# Patient Record
Sex: Female | Born: 1945 | Race: White | Hispanic: No | Marital: Married | Smoking: Never smoker
Health system: Southern US, Community
[De-identification: ages and names within clinical notes are randomized; demographics above are authoritative.]

## PROBLEM LIST (undated history)

## (undated) DIAGNOSIS — I10 Essential (primary) hypertension: Secondary | ICD-10-CM

## (undated) DIAGNOSIS — E785 Hyperlipidemia, unspecified: Secondary | ICD-10-CM

## (undated) DIAGNOSIS — C801 Malignant (primary) neoplasm, unspecified: Secondary | ICD-10-CM

## (undated) DIAGNOSIS — E119 Type 2 diabetes mellitus without complications: Secondary | ICD-10-CM

## (undated) DIAGNOSIS — E079 Disorder of thyroid, unspecified: Secondary | ICD-10-CM

## (undated) HISTORY — DX: Malignant (primary) neoplasm, unspecified: C80.1

## (undated) HISTORY — DX: Essential (primary) hypertension: I10

## (undated) HISTORY — PX: CYST EXCISION: SHX5701

## (undated) HISTORY — DX: Disorder of thyroid, unspecified: E07.9

## (undated) HISTORY — DX: Hyperlipidemia, unspecified: E78.5

## (undated) HISTORY — DX: Type 2 diabetes mellitus without complications: E11.9

---

## 2005-05-26 ENCOUNTER — Ambulatory Visit (HOSPITAL_COMMUNITY): Admission: RE | Admit: 2005-05-26 | Discharge: 2005-05-26 | Payer: Self-pay | Admitting: Specialist

## 2006-07-03 ENCOUNTER — Ambulatory Visit (HOSPITAL_COMMUNITY): Admission: RE | Admit: 2006-07-03 | Discharge: 2006-07-03 | Payer: Self-pay | Admitting: Unknown Physician Specialty

## 2009-02-16 ENCOUNTER — Ambulatory Visit (HOSPITAL_COMMUNITY): Admission: RE | Admit: 2009-02-16 | Discharge: 2009-02-16 | Payer: Self-pay | Admitting: Unknown Physician Specialty

## 2010-08-10 IMAGING — CT CT CHEST W/ CM
2 of 5 series · 13 of 36 positions shown, 16 images · IV contrast (Omnipaque 300)
Comparison: 07/03/2006.

CT CHEST

CLINICAL DATA: History of endometrial carcinoma.  Left-sided back
and abdominal pain.  Left hip pain.

CT CHEST, ABDOMEN AND PELVIS WITH CONTRAST
TECHNIQUE: Multidetector CT imaging of the chest, abdomen and
pelvis was performed following the standard protocol during bolus
administration of intravenous contrast.
Contrast: 100 ml 9mnipaque-BJJ.

[Series 2: cap with 5.0 b40f · axial · 0.64mm/px · z∈[-592,-102]mm · 10 of 118 slices shown, 13 images]
[im 10/118  mediastinal]
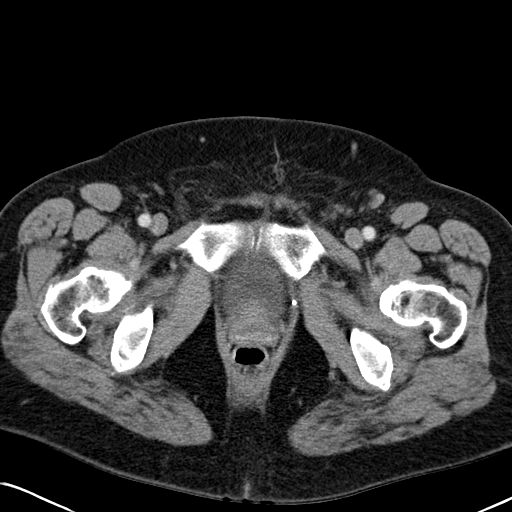
[im 10/118  lung]
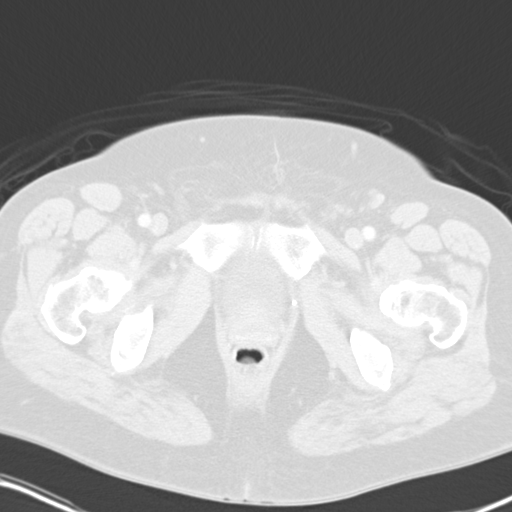
[im 20/118  lung]
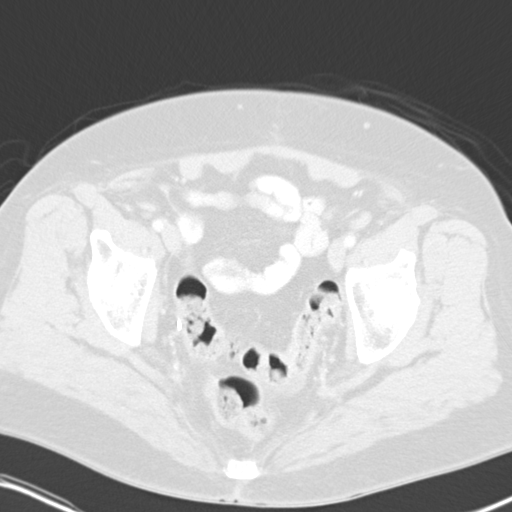
[im 30/118  lung]
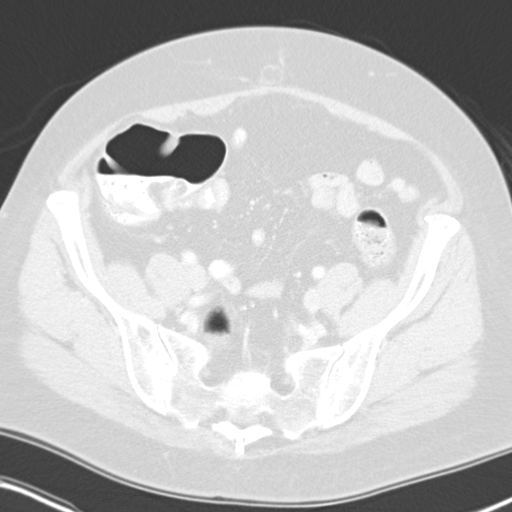
[im 40/118  lung]
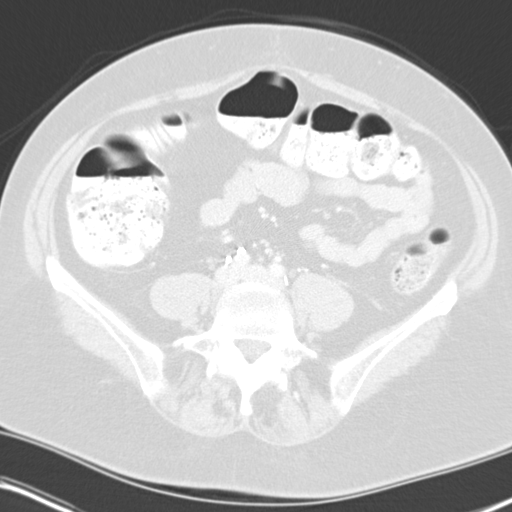
[im 49/118  mediastinal]
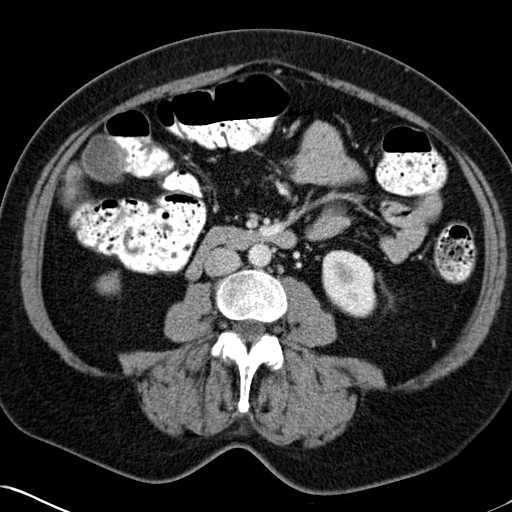
[im 49/118  lung]
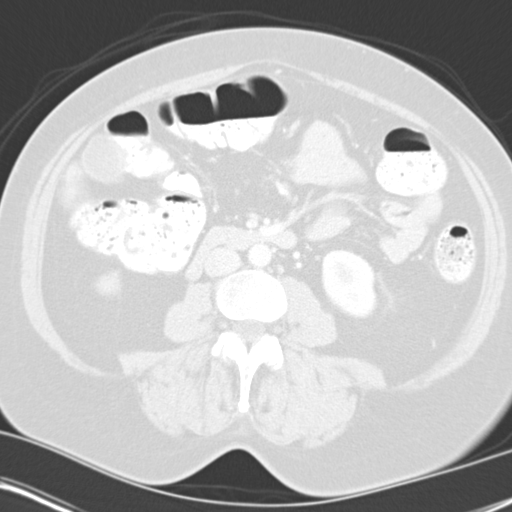
[im 69/118  lung]
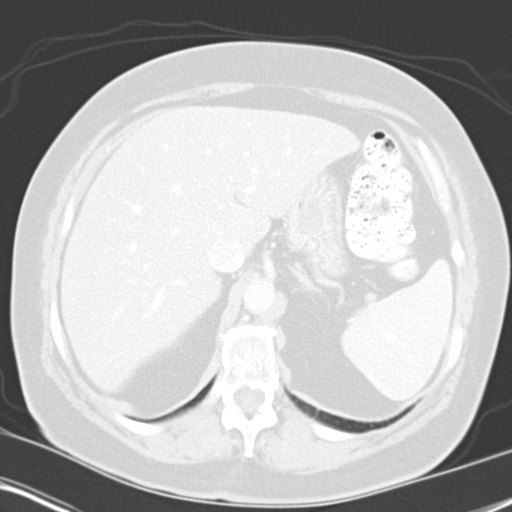
[im 79/118  lung]
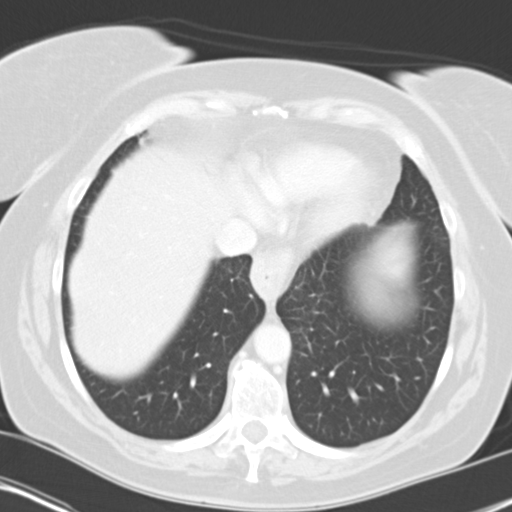
[im 88/118  lung]
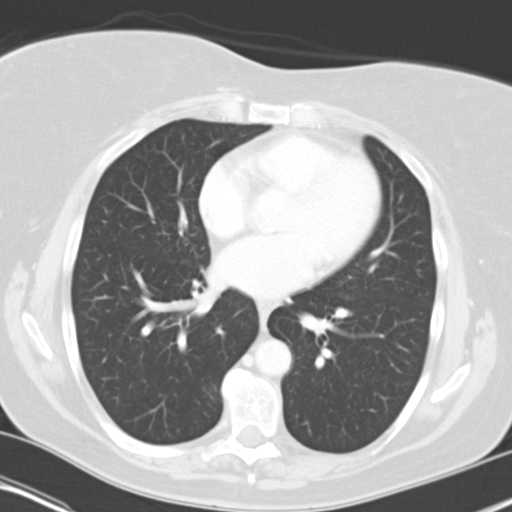
[im 98/118  mediastinal]
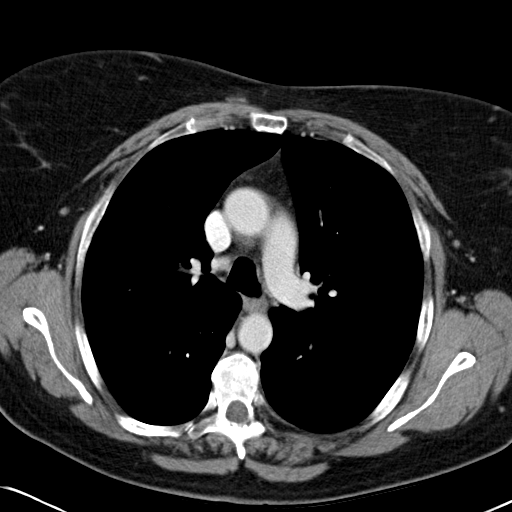
[im 98/118  lung]
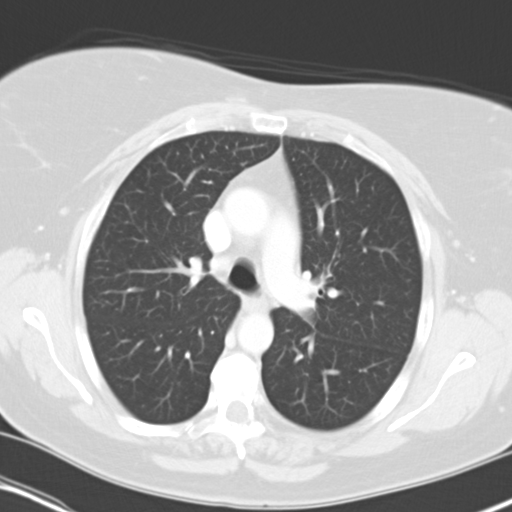
[im 108/118  lung]
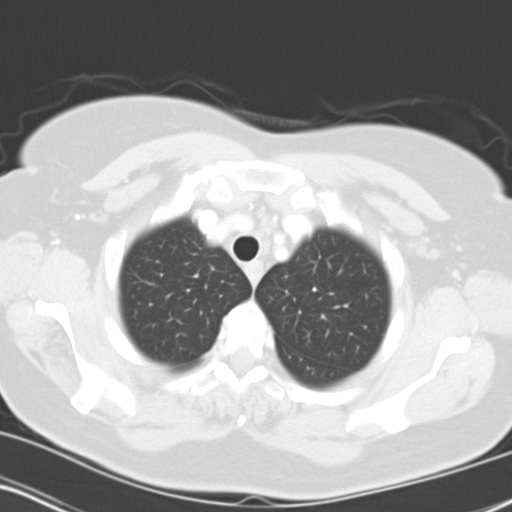

[Series 4: mpr cor post contrast (id) · coronal · 0.61mm/px · 3 of 90 slices shown]
[im 18/90  lung]
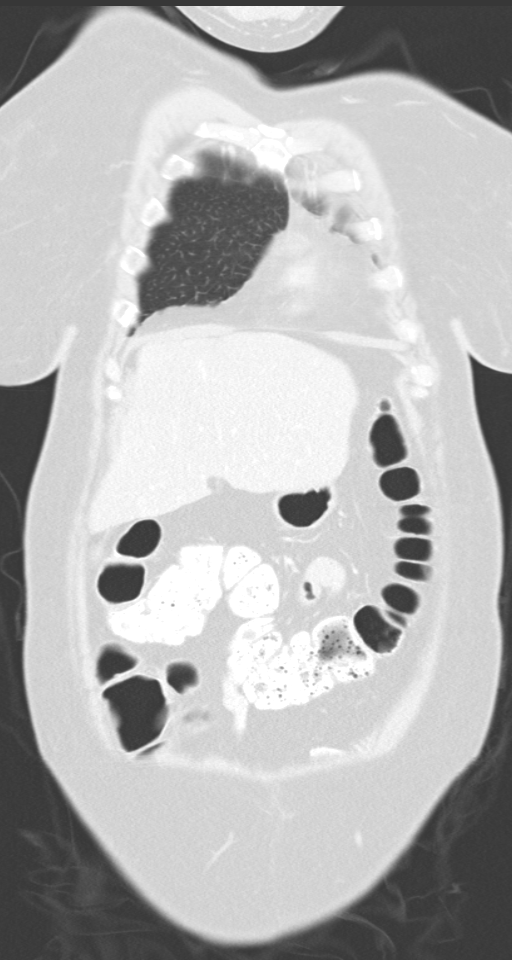
[im 36/90  lung]
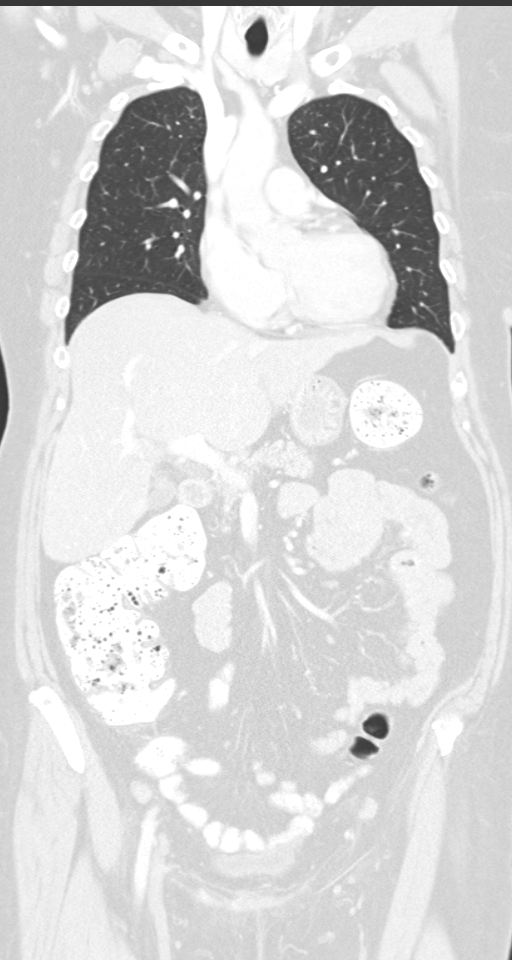
[im 54/90  lung]
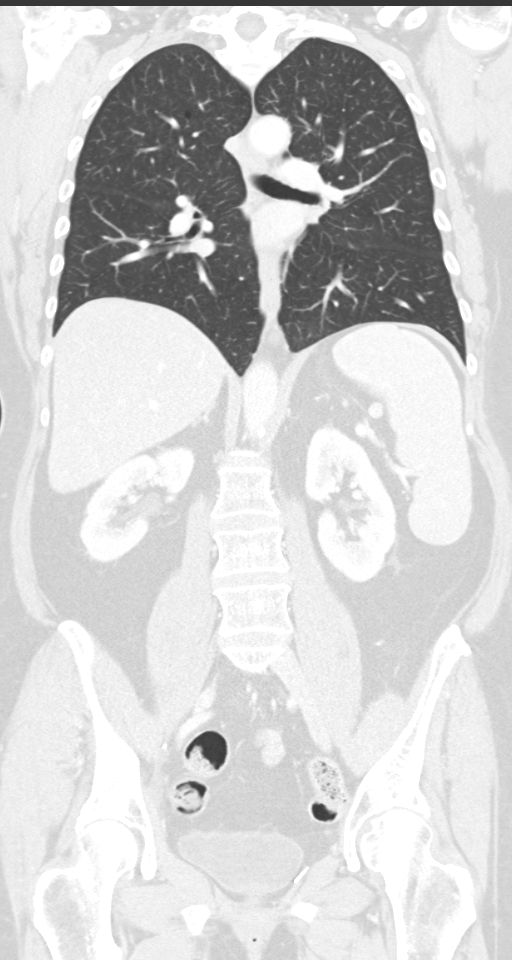

[13 of 36 positions shown; findings below may reference images not displayed]

FINDINGS: Minimal nodularity anterior aspect right middle lobe
(series 3 image 37) unchanged.  Minimal nodularity posterior-
superior right upper lobe (series 3 image 9 and series 4 image 66)
unchanged .  Scarring inferior lingula.  Accessory bronchus on the
right.  The trachea mainstem bronchi are patent.

Small hiatal hernia.  Thickening of the distal esophagus leading
into the hiatal hernia an underlying mass cannot be excluded.
Direct visualization recommended for further evaluation.

No mediastinal or hilar adenopathy.  No pericardial effusion.
Thoracic kyphosis with degenerative changes.  Scoliosis.  No bony
destructive lesion.
IMPRESSION: Thickening of the distal esophagus leading into a hiatal hernia.
Endoscopy recommended for further delineation.

CT ABDOMEN
FINDINGS: Fatty liver.  No focal hepatic lesion.  Question
gallstones?  No focal renal, adrenal, splenic or pancreatic lesion.
Atherosclerotic type changes of the aorta without aneurysmal
dilatation.  Under distended stomach.  No obvious bowel
abnormality.  No upper abdominal adenopathy.  Degenerative changes
and scoliosis lumbar spine without bony destructive lesion.  Mild
diastasis rectus muscles without true hernia.
IMPRESSION: Fatty liver.

Question gallstone.

Mild atherosclerotic type changes aorta without aneurysmal
dilatation.

No evidence of metastatic disease upper abdomen.

CT PELVIS
FINDINGS: Status post hysterectomy.  Interval development of small
amount of fluid within the pelvis/sigmoid mesentery.  Etiology
indeterminate.  Left external iliac slightly enlarged 2.1 x 1 cm
(series 2 image 100) lymph node unchanged.  Status post lymph node
dissection.  No pelvic inflammatory process.  No destructive
lesion. Bladder unremarkable.
IMPRESSION: Interval development of small amount of fluid within the pelvis.
Etiology indeterminate.

Stable appearance of  slightly enlarged left external iliac lymph
node.

## 2011-02-12 LAB — CREATININE, SERUM
Creatinine, Ser: 0.89 mg/dL (ref 0.4–1.2)
GFR calc Af Amer: >60 mL/min (ref >60)
GFR calc non Af Amer: >60 mL/min (ref >60)

## 2021-09-11 LAB — TSH: TSH: 0.01 — AB (ref 0.41–5.90)

## 2021-11-12 ENCOUNTER — Ambulatory Visit: Payer: Self-pay | Admitting: Nurse Practitioner

## 2021-12-02 LAB — BASIC METABOLIC PANEL
BUN: 22 — AB (ref 4–21)
CO2: 25 — AB (ref 13–22)
Chloride: 106 (ref 99–108)
Creatinine: 10.7 — AB (ref <=1.1)
Glucose: 250
Potassium: 4.3 (ref 3.4–5.3)
Sodium: 139 (ref 137–147)

## 2021-12-02 LAB — HEMOGLOBIN A1C: Hemoglobin A1C: 6.7

## 2021-12-02 LAB — CBC AND DIFFERENTIAL
HCT: 41 (ref 36–46)
Hemoglobin: 13 (ref 12.0–16.0)
Neutrophils Absolute: 3.26
Platelets: 337 (ref 150–399)
WBC: 6.8

## 2021-12-02 LAB — LIPID PANEL
Cholesterol: 131 (ref 0–200)
HDL: 37 (ref 35–70)
LDL Cholesterol: 57
Triglycerides: 186 — AB (ref 40–160)

## 2021-12-02 LAB — CBC: RBC: 4.6 (ref 3.87–5.11)

## 2021-12-02 LAB — COMPREHENSIVE METABOLIC PANEL: Calcium: 9.4 (ref 8.7–10.7)

## 2021-12-02 LAB — VITAMIN D 25 HYDROXY (VIT D DEFICIENCY, FRACTURES): Vit D, 25-Hydroxy: 25

## 2021-12-16 ENCOUNTER — Ambulatory Visit: Payer: Medicare HMO | Admitting: Nurse Practitioner

## 2021-12-16 ENCOUNTER — Encounter: Payer: Self-pay | Admitting: Nurse Practitioner

## 2021-12-16 ENCOUNTER — Other Ambulatory Visit: Payer: Self-pay

## 2021-12-16 ENCOUNTER — Telehealth: Payer: Self-pay

## 2021-12-16 VITALS — BP 99/56 | HR 66 | Ht 60.25 in | Wt 158.8 lb

## 2021-12-16 DIAGNOSIS — E039 Hypothyroidism, unspecified: Secondary | ICD-10-CM

## 2021-12-16 NOTE — Patient Instructions (Signed)
Hyperthyroidism Hyperthyroidism is when the thyroid gland is too active (overactive). The thyroid gland is a small gland located in the lower front part of the neck, just in front of the windpipe (trachea). This gland makes hormones that help control how the body uses food for energy (metabolism) as well as how the heart and brain function. These hormones also play a role in keeping your bones strong. When the thyroid is overactive, it produces too much of a hormone called thyroxine. What are the causes? This condition may be caused by: Graves' disease. This is a disorder in which the body's disease-fighting system (immune system) attacks the thyroid gland. This is the most common cause. Inflammation of the thyroid gland. A tumor in the thyroid gland. Use of certain medicines, including: Prescription thyroid hormone replacement. Herbal supplements that mimic thyroid hormones. Amiodarone therapy. Solid or fluid-filled lumps within your thyroid gland (thyroid nodules). Taking in a large amount of iodine from foods or medicines. What increases the risk? You are more likely to develop this condition if: You are female. You have a family history of thyroid conditions. You smoke tobacco. You use a medicine called lithium. You take medicines that affect the immune system (immunosuppressants). What are the signs or symptoms? Symptoms of this condition include: Nervousness. Inability to tolerate heat. Unexplained weight loss. Diarrhea. Change in the texture of hair or skin. Heart skipping beats or making extra beats. Rapid heart rate. Loss of menstruation. Shaky hands. Fatigue. Restlessness. Sleep problems. Enlarged thyroid gland or a lump in the thyroid (nodule). You may also have symptoms of Graves' disease, which may include: Protruding eyes. Dry eyes. Red or swollen eyes. Problems with vision. How is this diagnosed? This condition may be diagnosed based on: Your symptoms and  medical history. A physical exam. Blood tests. Thyroid ultrasound. This test involves using sound waves to produce images of the thyroid gland. A thyroid scan. A radioactive substance is injected into a vein, and images show how much iodine is present in the thyroid. Radioactive iodine uptake test (RAIU). A small amount of radioactive iodine is given by mouth to see how much iodine the thyroid absorbs after a certain amount of time. How is this treated? Treatment depends on the cause and severity of the condition. Treatment may include: Medicines to reduce the amount of thyroid hormone your body makes. Radioactive iodine treatment (radioiodine therapy). This involves swallowing a small dose of radioactive iodine, in capsule or liquid form, to kill thyroid cells. Surgery to remove part or all of your thyroid gland. You may need to take thyroid hormone replacement medicine for the rest of your life after thyroid surgery. Medicines to help manage your symptoms. Follow these instructions at home:  Take over-the-counter and prescription medicines only as told by your health care provider. Do not use any products that contain nicotine or tobacco, such as cigarettes and e-cigarettes. If you need help quitting, ask your health care provider. Follow any instructions from your health care provider about diet. You may be instructed to limit foods that contain iodine. Keep all follow-up visits as told by your health care provider. This is important. You will need to have blood tests regularly so that your health care provider can monitor your condition. Contact a health care provider if: Your symptoms do not get better with treatment. You have a fever. You are taking thyroid hormone replacement medicine and you: Have symptoms of depression. Feel like you are tired all the time. Gain weight. Get help right  away if: You have chest pain. You have decreased alertness or a change in your awareness. You  have abdominal pain. You feel dizzy. You have a rapid heartbeat. You have an irregular heartbeat. You have difficulty breathing. Summary The thyroid gland is a small gland located in the lower front part of the neck, just in front of the windpipe (trachea). Hyperthyroidism is when the thyroid gland is too active (overactive) and produces too much of a hormone called thyroxine. The most common cause is Graves' disease, a disorder in which your immune system attacks the thyroid gland. Hyperthyroidism can cause various symptoms, such as unexplained weight loss, nervousness, inability to tolerate heat, or changes in your heartbeat. Treatment may include medicine to reduce the amount of thyroid hormone your body makes, radioiodine therapy, surgery, or medicines to manage symptoms. This information is not intended to replace advice given to you by your health care provider. Make sure you discuss any questions you have with your health care provider. Document Revised: 07/05/2020 Document Reviewed: 07/05/2020 Elsevier Patient Education  2022 [employer].

## 2021-12-16 NOTE — Telephone Encounter (Signed)
Called Dr. Ephriam Jenkins at [facility] in [place] at [phone] and requested all labs to be faxed to [place]. Rosalin stated that she will send them over.

## 2021-12-16 NOTE — Progress Notes (Signed)
Endocrinology Consult Note                                         12/16/2021, 3:15 PM  Subjective:   Subjective    Brandi [name] is a 76 y.o.-year-old female patient being seen in consultation for hypothyroidism referred by Thea Alken.   Past Medical History:  Diagnosis Date   Cancer ([place])    Diabetes mellitus ([place])    Hyperlipidemia    Hypertension    Thyroid disease     Past Surgical History:  Procedure Laterality Date   CYST EXCISION     1990    Social History   Socioeconomic History   Marital status: Married    Spouse name: Not on file   Number of children: Not on file   Years of education: Not on file   Highest education level: Not on file  Occupational History   Not on file  Tobacco Use   Smoking status: Never    Passive exposure: Past   Smokeless tobacco: Never  Vaping Use   Vaping Use: Never used  Substance and Sexual Activity   Alcohol use: Yes   Drug use: Never   Sexual activity: Not on file  Other Topics Concern   Not on file  Social History Narrative   Not on file   Social Determinants of Health   Financial Resource Strain: Not on file  Food Insecurity: Not on file  Transportation Needs: Not on file  Physical Activity: Not on file  Stress: Not on file  Social Connections: Not on file    Family History  Problem Relation Age of Onset   Hypertension Mother    Diabetes Mother    Hyperlipidemia Mother    Heart attack Mother    Hypertension Father    Stroke Father     Outpatient Encounter Medications as of 12/16/2021  Medication Sig   acetaminophen (TYLENOL) 500 MG tablet Take by mouth.   amLODipine (NORVASC) 5 MG tablet Take 5 mg by mouth daily.   atorvastatin (LIPITOR) 20 MG tablet Take 20 mg by mouth daily.   citalopram (CELEXA) 10 MG tablet Take 10 mg by mouth daily.   fluconazole (DIFLUCAN) 150 MG tablet Take 150 mg by mouth every morning.    glimepiride (AMARYL) 4 MG tablet Take 4 mg by mouth 2 (two) times daily.   levothyroxine (SYNTHROID) 50 MCG tablet Take 50 mcg by mouth every morning.   losartan (COZAAR) 100 MG tablet Take 100 mg by mouth daily.   metFORMIN (GLUCOPHAGE) 500 MG tablet Take 500 mg by mouth daily.   moxifloxacin (VIGAMOX) 0.5 % ophthalmic solution Apply to eye.   triamcinolone cream (KENALOG) 0.1 % SMARTSIG:Sparingly Topical Twice Daily   warfarin (COUMADIN) 5 MG tablet Take by mouth. Takes 2 1/2 every day except on Wednesdays and Sundays only takes 55m   No facility-administered encounter medications on file as of 12/16/2021.    ALLERGIES: Allergies  Allergen Reactions   Penicillins Swelling  Sufentanil Swelling   Sulfa Antibiotics Rash and Swelling   VACCINATION STATUS:  There is no immunization history on file for this patient.   HPI   Brandi [name]  is a patient with the above medical history. she was diagnosed with hypothyroidism at approximate age of 25 years, which required subsequent initiation of thyroid hormone replacement. she was given various doses of Levothyroxine over the years, currently on 50 micrograms. She was off this medication briefly when she was sick back in January.  she reports compliance to this medication:  however she takes this medication along with her other morning pills.   I reviewed patient's thyroid tests:  Lab Results  Component Value Date   TSH 0.01 (A) 09/11/2021     Pt describes: - fatigue - leg swelling  Pt denies feeling nodules in neck, hoarseness, dysphagia/odynophagia, SOB with lying down.  she does have family history of thyroid disorders in her sister (hypothyroidism).  No family history of thyroid cancer.  No history of radiation therapy to head or neck.  No recent use of iodine supplements.  Denies use of Biotin containing supplements.  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   ROS:  Constitutional: no weight gain/loss, + debilitating fatigue, no subjective hyperthermia, no subjective hypothermia Eyes: no blurry vision, no xerophthalmia ENT: no sore throat, no nodules palpated in throat, no dysphagia/odynophagia, no hoarseness Cardiovascular: no chest pain, no SOB, no palpitations, mild ankle swelling Respiratory: no cough, no SOB Gastrointestinal: no nausea/vomiting/diarrhea Musculoskeletal: no muscle/joint aches Skin: no rashes Neurological: no tremors, no numbness, no tingling, no dizziness Psychiatric: no depression, no anxiety   Objective:   Objective     BP (!) 99/56    Pulse 66    Ht 5' 0.25" (1.53 m)    Wt 158 lb 12.8 oz (72 kg)    SpO2 98%    BMI 30.76 kg/m  Wt Readings from Last 3 Encounters:  12/16/21 158 lb 12.8 oz (72 kg)    BP Readings from Last 3 Encounters:  12/16/21 (!) 99/56     Constitutional:  Body mass index is 30.76 kg/m., not in acute distress, normal state of mind Eyes: PERRLA, EOMI, no exophthalmos ENT: moist mucous membranes, no thyromegaly, no palpable nodularity, no cervical lymphadenopathy Cardiovascular: normal precordial activity, RRR, no murmur/rubs/gallops Respiratory:  adequate breathing efforts, no gross chest deformity, Clear to auscultation bilaterally Gastrointestinal: abdomen soft, non-tender, no distension, bowel sounds present Musculoskeletal: no gross deformities, strength intact in all four extremities Skin: moist, warm, no rashes Neurological: no tremor with outstretched hands, deep tendon reflexes normal in BLE.   CMP ( most recent) CMP     Component Value Date/Time   CREATININE 0.89 02/16/2009 0905   GFRNONAA >60 02/16/2009 0905   GFRAA  02/16/2009 0905    >60        The eGFR has been calculated using the MDRD equation. This calculation has not been validated in all clinical situations. eGFR's persistently <60 mL/min  signify possible Chronic Kidney Disease.     Diabetic Labs (most recent): No results found for: HGBA1C   Lipid Panel ( most recent) Lipid Panel  No results found for: CHOL, TRIG, HDL, CHOLHDL, VLDL, LDLCALC, LDLDIRECT, LABVLDL     Lab Results  Component Value Date   TSH 0.01 (A) 09/11/2021      Assessment &  Plan:   ASSESSMENT / PLAN:  1. Hypothyroidism-unspecified   Patient with long-standing hypothyroidism, on levothyroxine therapy. On physical exam , patient  does not  have  gross goiter, thyroid nodules, or neck compression symptoms.  Her most recent labs from November are consistent with over-replacement.  She says she has had more labs done about 1 week ago but I have no values to review.  - For now, she is advised to continue her current dose of Levothyroxine 50 mcg po daily before breakfast and to start taking it properly.  - We discussed about correct intake of levothyroxine, at fasting, with water, separated by at least 30 minutes from breakfast, and separated by more than 4 hours from calcium, iron, multivitamins, acid reflux medications (PPIs). -Patient is made aware of the fact that thyroid hormone replacement is needed for life, dose to be adjusted by periodic monitoring of thyroid function tests.  - Will check thyroid tests before next visit: TSH, free T4, will also add antibody testing to help classify her dysfunction.  -Due to absence of clinical goiter, no need for thyroid ultrasound.  - Time spent with the patient: 45 minutes, of which >50% was spent in obtaining information about her symptoms, reviewing her previous labs, evaluations, and treatments, counseling her about her hypothyroidism, and developing a plan to confirm the diagnosis and long term treatment as necessary. Please refer to "Patient Self Inventory" in the Media tab for reviewed elements of pertinent patient history.  Brandi [name] participated in the discussions, expressed understanding,  and voiced agreement with the above plans.  All questions were answered to her satisfaction. she is encouraged to contact clinic should she have any questions or concerns prior to her return visit.   FOLLOW UP PLAN:  Return in about 1 week (around 12/23/2021) for Thyroid follow up, Previsit labs.  Brandi [name], [facility] [address] [place], [place] [address] Phone: [phone] Fax: [phone]  12/16/2021, 3:15 PM

## 2021-12-17 ENCOUNTER — Telehealth: Payer: Self-pay

## 2021-12-17 NOTE — Telephone Encounter (Signed)
Called patient and left a VM for her to call back to discuss lab results.

## 2021-12-19 LAB — THYROID PEROXIDASE ANTIBODY: Thyroperoxidase Ab SerPl-aCnc: 323 IU/mL — ABNORMAL HIGH (ref 0–34)

## 2021-12-19 LAB — T4, FREE: Free T4: 2.91 ng/dL — ABNORMAL HIGH (ref 0.82–1.77)

## 2021-12-19 LAB — TSH: TSH: <0.005 u[IU]/mL — ABNORMAL LOW (ref 0.450–4.500)

## 2021-12-19 LAB — THYROGLOBULIN ANTIBODY: Thyroglobulin Antibody: 7.5 IU/mL — ABNORMAL HIGH (ref 0.0–0.9)

## 2021-12-23 ENCOUNTER — Ambulatory Visit: Payer: Medicare HMO | Admitting: Nurse Practitioner

## 2021-12-23 ENCOUNTER — Other Ambulatory Visit: Payer: Self-pay

## 2021-12-23 ENCOUNTER — Encounter: Payer: Self-pay | Admitting: Nurse Practitioner

## 2021-12-23 VITALS — BP 112/65 | HR 61 | Ht 60.25 in | Wt 159.2 lb

## 2021-12-23 DIAGNOSIS — E559 Vitamin D deficiency, unspecified: Secondary | ICD-10-CM

## 2021-12-23 DIAGNOSIS — E1122 Type 2 diabetes mellitus with diabetic chronic kidney disease: Secondary | ICD-10-CM | POA: Diagnosis not present

## 2021-12-23 DIAGNOSIS — E782 Mixed hyperlipidemia: Secondary | ICD-10-CM

## 2021-12-23 DIAGNOSIS — I10 Essential (primary) hypertension: Secondary | ICD-10-CM

## 2021-12-23 DIAGNOSIS — N1831 Chronic kidney disease, stage 3a: Secondary | ICD-10-CM

## 2021-12-23 DIAGNOSIS — E038 Other specified hypothyroidism: Secondary | ICD-10-CM

## 2021-12-23 DIAGNOSIS — E063 Autoimmune thyroiditis: Secondary | ICD-10-CM

## 2021-12-23 NOTE — Progress Notes (Signed)
Endocrinology Follow Up Note                                         12/23/2021, 4:53 PM  Subjective:   Subjective    Brandi [name] is a 76 y.o.-year-old female patient being seen in follow up after being seen in consultation for hypothyroidism referred by Ephriam Jenkins E.  She would also like to be seen here for her diabetes management as well.   Past Medical History:  Diagnosis Date   Cancer ([place])    Diabetes mellitus ([place])    Hyperlipidemia    Hypertension    Thyroid disease     Past Surgical History:  Procedure Laterality Date   CYST EXCISION     1990    Social History   Socioeconomic History   Marital status: Married    Spouse name: Not on file   Number of children: Not on file   Years of education: Not on file   Highest education level: Not on file  Occupational History   Not on file  Tobacco Use   Smoking status: Never    Passive exposure: Past   Smokeless tobacco: Never  Vaping Use   Vaping Use: Never used  Substance and Sexual Activity   Alcohol use: Yes   Drug use: Never   Sexual activity: Not on file  Other Topics Concern   Not on file  Social History Narrative   Not on file   Social Determinants of Health   Financial Resource Strain: Not on file  Food Insecurity: Not on file  Transportation Needs: Not on file  Physical Activity: Not on file  Stress: Not on file  Social Connections: Not on file    Family History  Problem Relation Age of Onset   Hypertension Mother    Diabetes Mother    Hyperlipidemia Mother    Heart attack Mother    Hypertension Father    Stroke Father     Outpatient Encounter Medications as of 12/23/2021  Medication Sig   acetaminophen (TYLENOL) 500 MG tablet Take by mouth.   atorvastatin (LIPITOR) 20 MG tablet Take 20 mg by mouth daily.   citalopram (CELEXA) 10 MG tablet Take 10 mg by mouth daily.   fluconazole (DIFLUCAN) 150 MG tablet  Take 150 mg by mouth every morning.   levothyroxine (SYNTHROID) 50 MCG tablet Take 25 mcg by mouth every morning.   losartan (COZAAR) 100 MG tablet Take 100 mg by mouth daily.   metFORMIN (GLUCOPHAGE) 500 MG tablet Take 500 mg by mouth daily.   moxifloxacin (VIGAMOX) 0.5 % ophthalmic solution Apply to eye.   triamcinolone cream (KENALOG) 0.1 % SMARTSIG:Sparingly Topical Twice Daily   warfarin (COUMADIN) 5 MG tablet Take by mouth. Takes 2 1/2 every day except on Wednesdays and Sundays only takes 66m   [DISCONTINUED] amLODipine (NORVASC) 5 MG tablet Take 5 mg by mouth daily.   [DISCONTINUED] glimepiride (AMARYL) 4 MG tablet Take 4 mg by mouth 2 (two) times daily.  No facility-administered encounter medications on file as of 12/23/2021.    ALLERGIES: Allergies  Allergen Reactions   Penicillins Swelling   Sufentanil Swelling   Sulfa Antibiotics Rash and Swelling   VACCINATION STATUS:  There is no immunization history on file for this patient.   HPI  Diabetes She presents for her initial diabetic visit. She has type 2 diabetes mellitus. Onset time: Diagnosed at approx age of 44. Her disease course has been stable. Hypoglycemia symptoms include nervousness/anxiousness and sweats. Pertinent negatives for hypoglycemia include no tremors. Associated symptoms include fatigue. Pertinent negatives for diabetes include no weight loss. There are no hypoglycemic complications. Symptoms are worsening. Diabetic complications include nephropathy. Risk factors for coronary artery disease include diabetes mellitus, dyslipidemia, family history, hypertension, post-menopausal, sedentary lifestyle and stress. Current diabetic treatment includes oral agent (dual therapy). She is compliant with treatment most of the time. Her weight is fluctuating minimally. She is following a generally unhealthy diet. When asked about meal planning, she reported none. She has not had a previous visit with a dietitian. She  participates in exercise intermittently. (She presents today with no meter or logs to review.  She does not currently have a glucose monitor at home.  Her most recent A1c was 6.7% on 12/02/21 at her PCP office who restarted her on Metformin 500 mg po daily and Glimepiride 4 mg po twice daily.  She had previously been able to manage her diabetes with diet and exercise and was able to come off all medication.  She drinks mostly water (sometimes with SF flavoring) and eats 2 meals per day, no snacks.  She routinely exercises but admits she could be better about it.  She would like to discuss coming off some of her medications because since starting them, she has felt more fatigued than ever.) An ACE inhibitor/angiotensin II receptor blocker is being taken. She does not see a podiatrist.Eye exam is current.  Thyroid Problem Presents for follow-up visit. Symptoms include anxiety and fatigue. Patient reports no constipation, depressed mood, hair loss, leg swelling, tremors, weight gain or weight loss. The symptoms have been improving.   Brandi [name]  is a patient with the above medical history. she was diagnosed with hypothyroidism at approximate age of 10 years, which required subsequent initiation of thyroid hormone replacement. she was given various doses of Levothyroxine over the years, currently on 25 (lowered between visits due to lab results) micrograms. She was off this medication briefly when she was sick back in January.  she reports compliance to this medication, now taking it properly.  I reviewed patient's thyroid tests:  Lab Results  Component Value Date   TSH <0.005 (L) 12/16/2021   TSH 0.01 (A) 09/11/2021   FREET4 2.91 (H) 12/16/2021    Pt denies feeling nodules in neck, hoarseness, dysphagia/odynophagia, SOB with lying down.  she does have family history of thyroid disorders in her sister (hypothyroidism).  No family history of thyroid cancer.  No history of radiation therapy to  head or neck.  No recent use of iodine supplements.  Denies use of Biotin containing supplements.  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   ROS:  Constitutional: no weight gain/loss, + debilitating fatigue, no subjective hyperthermia, no subjective hypothermia Eyes: no blurry vision, no xerophthalmia ENT: no sore throat, no nodules palpated in throat, no dysphagia/odynophagia, no hoarseness Cardiovascular:  no chest pain, no SOB, no palpitations, mild ankle swelling Respiratory: no cough, no SOB Gastrointestinal: no nausea/vomiting/diarrhea Musculoskeletal: no muscle/joint aches Skin: no rashes Neurological: no tremors, no numbness, no tingling, no dizziness Psychiatric: no depression, no anxiety   Objective:   Objective     BP 112/65    Pulse 61    Ht 5' 0.25" (1.53 m)    Wt 159 lb 3.2 oz (72.2 kg)    SpO2 99%    BMI 30.83 kg/m  Wt Readings from Last 3 Encounters:  12/23/21 159 lb 3.2 oz (72.2 kg)  12/16/21 158 lb 12.8 oz (72 kg)    BP Readings from Last 3 Encounters:  12/23/21 112/65  12/16/21 (!) 99/56      Physical Exam- Limited  Constitutional:  Body mass index is 30.83 kg/m. , not in acute distress, normal state of mind Eyes:  EOMI, no exophthalmos Neck: Supple Cardiovascular: RRR, no murmurs, rubs, or gallops, no edema Respiratory: Adequate breathing efforts, no crackles, rales, rhonchi, or wheezing Musculoskeletal: no gross deformities, strength intact in all four extremities, no gross restriction of joint movements Skin:  no rashes, no hyperemia Neurological: no tremor with outstretched hands   CMP ( most recent) CMP     Component Value Date/Time   NA 139 12/02/2021 0000   K 4.3 12/02/2021 0000   CL 106 12/02/2021 0000   CO2 25 (A) 12/02/2021 0000   BUN 22 (A) 12/02/2021 0000   CREATININE 10.7 (A) 12/02/2021 0000   CREATININE  0.89 02/16/2009 0905   CALCIUM 9.4 12/02/2021 0000   GFRNONAA >60 02/16/2009 0905   GFRAA  02/16/2009 0905    >60        The eGFR has been calculated using the MDRD equation. This calculation has not been validated in all clinical situations. eGFR's persistently <60 mL/min signify possible Chronic Kidney Disease.     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  Her repeat thyroid labs are consistent with over-replacement.  She was advised to lower her dose of Levothyroxine to 25 mcg po daily before breakfast.  She will need repeat labs in about 6 weeks or so to see if further adjustments are needed.  - We discussed about correct intake of levothyroxine, at fasting, with water, separated by at least 30 minutes from breakfast, and separated by more than 4 hours from calcium, iron, multivitamins, acid reflux medications (PPIs). -Patient is made aware of the fact that thyroid hormone replacement is needed for life, dose to be adjusted by periodic monitoring of thyroid function tests.  -Due to absence of clinical goiter, no need for thyroid ultrasound.   2) Type 2 Diabetes  She presents today with no meter or logs to review.  She does not currently have a glucose monitor at home.  Her most recent A1c was 6.7% on 12/02/21 at her  PCP office who restarted her on Metformin 500 mg po daily and Glimepiride 4 mg po twice daily.  She had previously been  able to manage her diabetes with diet and exercise and was able to come off all medication.  She drinks mostly water (sometimes with SF flavoring) and eats 2 meals per day, no snacks.  She routinely exercises but admits she could be better about it.  She would like to discuss coming off some of her medications because since starting them, she has felt more fatigued than ever.  - Kirk has currently uncontrolled symptomatic type 2 DM since 76 years of age, with most recent A1c of 6.7 %.   -Recent labs reviewed.  - I had a long discussion with her about the progressive nature of diabetes and the pathology behind its complications. 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 These are all discussed in detail with her.  The following Lifestyle Medicine recommendations according to [facility]) were discussed and offered to patient and she agrees to start the journey:  A. Whole Foods, Plant-based plate comprising of fruits and vegetables, plant-based proteins, whole-grain carbohydrates was discussed in detail with the patient.   A list for source of those nutrients were also provided to the patient.  Patient will use only water or unsweetened tea for hydration. B.  The need to stay away from risky substances including alcohol, smoking; obtaining 7 to 9 hours of restorative sleep, at least 150 minutes of moderate intensity exercise weekly, the importance of healthy social connections,  and stress reduction techniques were discussed. C.  A full color page of  Calorie density of various food groups per pound showing examples of each food groups was provided to the patient.  - I have counseled her on diet and weight management by adopting a carbohydrate restricted/protein rich diet. Patient is encouraged to switch to unprocessed or minimally  processed complex starch and increased protein intake (animal or plant source), fruits, and vegetables. -  She is advised to stick to a routine mealtimes to eat 3 meals a day and avoid unnecessary snacks (to snack only to correct hypoglycemia).   - She acknowledges that there is a room for improvement in her food and drink choices. - Suggestion is made for her to avoid simple carbohydrates from her diet including Cakes, Sweet Desserts, Ice Cream, Soda (diet and regular), Sweet Tea, Candies, Chips, Cookies, Store Bought Juices, Alcohol in Excess of 1-2 drinks a day, Artificial Sweeteners, Coffee Creamer, and "Sugar-free" Products. This will help patient to have more stable blood glucose profile and potentially avoid unintended weight gain.  - She will be scheduled with Jearld Fenton, RDN, CDE for diabetes education.  - I have approached her with the following individualized plan to manage her diabetes and patient agrees:   -She is encouraged to start monitoring glucose 4 times daily, before meals and before bed, to log their readings on the clinic sheets provided, and bring them to review at follow up appointment in 2 weeks.  - Adjustment parameters are given to her for hypo and hyperglycemia in writing. - She is encouraged to call clinic for blood glucose levels less than 70 or above 300 mg /dl. -She is advised to continue Metformin 500 mg po daily, therapeutically suitable for patient . - Her Glimepiride will be discontinued, risk outweighs benefit for  this patient-given her account of hypoglycemic symptoms.  - She will be considered for incretin therapy as appropriate next visit.  - Specific targets for  A1c; LDL, HDL, and Triglycerides were discussed with the patient.  3) Blood Pressure /Hypertension: Her blood pressure is controlled to target.   She is advised to continue current medications including Losartan 100 mg p.o. daily with breakfast.  I discontinued her Norvasc today given her  borderline low BP.  4) Lipids/Hyperlipidemia:    Review of her recent lipid panel from 09/11/21 showed controlled LDL at 57 and slightly elevated triglycerides of 186.  She is advised to continue Lipitor 20 mg daily at bedtime.  Side effects and precautions discussed with her.  5)  Weight/Diet:  Her Body mass index is 30.83 kg/m.  - She is Not a candidate for weight loss.  Exercise, and detailed carbohydrates information provided  -  detailed on discharge instructions.  6) Chronic Care/Health Maintenance: -She is on ACEI/ARB and Statin medications and is encouraged to initiate and continue to follow up with Ophthalmology, Dentist, Podiatrist at least yearly or according to recommendations, and advised to stay away from smoking. I have recommended yearly flu vaccine and pneumonia vaccine at least every 5 years; moderate intensity exercise for up to 150 minutes weekly; and sleep for at least 7 hours a day.  - She is advised to maintain close follow up with Ephriam Jenkins for primary care needs, as well as her other providers for optimal and coordinated care.      I spent 45 minutes in the care of the patient today including review of labs from [place], Lipids, Thyroid Function, Hematology (current and previous including abstractions from other facilities); face-to-face time discussing  her blood glucose readings/logs, discussing hypoglycemia and hyperglycemia episodes and symptoms, medications doses, her options of short and long term treatment based on the latest standards of care / guidelines;  discussion about incorporating lifestyle medicine;  and documenting the encounter.    Please refer to Patient Instructions for Blood Glucose Monitoring and Insulin/Medications Dosing Guide"  in media tab for additional information. Please  also refer to " Patient Self Inventory" in the Media  tab for reviewed elements of pertinent patient history.  Brandi [name] participated in the discussions,  expressed understanding, and voiced agreement with the above plans.  All questions were answered to her satisfaction. she is encouraged to contact clinic should she have any questions or concerns prior to her return visit.   FOLLOW UP PLAN:  Return in about 1 month (around 01/20/2022) for Diabetes F/U, Bring meter and logs.  Rayetta Pigg, [facility] [address] [place], [place] [address] Phone: [phone] Fax: [phone]  12/23/2021, 4:53 PM

## 2021-12-23 NOTE — Patient Instructions (Signed)

## 2022-01-10 ENCOUNTER — Telehealth: Payer: Self-pay | Admitting: Nurse Practitioner

## 2022-01-10 NOTE — Telephone Encounter (Signed)
Pt needs strips called in for One Touch Verio Flex ?[facility] [address] [place] ?

## 2022-01-13 ENCOUNTER — Telehealth: Payer: Self-pay | Admitting: Nurse Practitioner

## 2022-01-13 MED ORDER — GLUCOSE BLOOD VI STRP: 1.0000 | ORAL_STRIP | Freq: Four times a day (QID) | 1 refills | Status: DC

## 2022-01-13 NOTE — Telephone Encounter (Signed)
I only plan on having her test that often to get a better understanding of her pattern anyway.

## 2022-01-13 NOTE — Telephone Encounter (Signed)
3 times a day is fine since insurance wont cover 4

## 2022-01-13 NOTE — Telephone Encounter (Signed)
Prior authorization will not approve testing 4 times daily.  ? ?Please advise if you want me to resend in for testing 3 times daily and call patient to advise for further instruction. ?

## 2022-01-15 NOTE — Telephone Encounter (Signed)
Patient said she needs a return call in regards to this. She said that you need to call her [employer] to tell them why the reason she needs this otherwise the test strips are $178 which she can not afford. Provider Services [phone] ?

## 2022-01-15 NOTE — Telephone Encounter (Signed)
I called the pharmacy and they will approve 100 strips for free. ? ?I called the patient and gave her the message and she stated that she is only checking her blood glucose 2 times daily. I confirmed if OK with [facility] and received approval. I also confirmed the next OV date on 01/21/2022 at 2pm. Patient verbalized an understanding. ?

## 2022-01-21 ENCOUNTER — Encounter: Payer: Self-pay | Admitting: Nurse Practitioner

## 2022-01-21 ENCOUNTER — Other Ambulatory Visit: Payer: Self-pay

## 2022-01-21 ENCOUNTER — Ambulatory Visit: Payer: Medicare HMO | Admitting: Nurse Practitioner

## 2022-01-21 VITALS — BP 146/61 | HR 50 | Ht 60.25 in | Wt 159.6 lb

## 2022-01-21 DIAGNOSIS — E782 Mixed hyperlipidemia: Secondary | ICD-10-CM

## 2022-01-21 DIAGNOSIS — E063 Autoimmune thyroiditis: Secondary | ICD-10-CM

## 2022-01-21 DIAGNOSIS — E559 Vitamin D deficiency, unspecified: Secondary | ICD-10-CM | POA: Diagnosis not present

## 2022-01-21 DIAGNOSIS — I10 Essential (primary) hypertension: Secondary | ICD-10-CM

## 2022-01-21 DIAGNOSIS — N1831 Chronic kidney disease, stage 3a: Secondary | ICD-10-CM

## 2022-01-21 DIAGNOSIS — E1122 Type 2 diabetes mellitus with diabetic chronic kidney disease: Secondary | ICD-10-CM

## 2022-01-21 DIAGNOSIS — E038 Other specified hypothyroidism: Secondary | ICD-10-CM | POA: Diagnosis not present

## 2022-01-21 LAB — POCT UA - MICROALBUMIN
Albumin/Creatinine Ratio, Urine, POC: <30
Creatinine, POC: 200 mg/dL
Microalbumin Ur, POC: 30 mg/L

## 2022-01-21 NOTE — Progress Notes (Signed)
?                                   ?                                Endocrinology Follow Up Note  ?                                       01/21/2022, 3:06 PM ? ?Subjective:  ? ?Subjective   ? ?Brandi [name] is a 76 y.o.-year-old female patient being seen in follow up after being seen in consultation for hypothyroidism referred by Ephriam Jenkins E.  She would also like to be seen here for her diabetes management as well. ? ? ?Past Medical History:  ?Diagnosis Date  ? Cancer [facility])   ? Diabetes mellitus ([place])   ? Hyperlipidemia   ? Hypertension   ? Thyroid disease   ? ? ?Past Surgical History:  ?Procedure Laterality Date  ? CYST EXCISION    ? 1990  ? ? ?Social History  ? ?Socioeconomic History  ? Marital status: Married  ?  Spouse name: Not on file  ? Number of children: Not on file  ? Years of education: Not on file  ? Highest education level: Not on file  ?Occupational History  ? Not on file  ?Tobacco Use  ? Smoking status: Never  ?  Passive exposure: Past  ? Smokeless tobacco: Never  ?Vaping Use  ? Vaping Use: Never used  ?Substance and Sexual Activity  ? Alcohol use: Yes  ? Drug use: Never  ? Sexual activity: Not on file  ?Other Topics Concern  ? Not on file  ?Social History Narrative  ? Not on file  ? ?Social Determinants of Health  ? ?Financial Resource Strain: Not on file  ?Food Insecurity: Not on file  ?Transportation Needs: Not on file  ?Physical Activity: Not on file  ?Stress: Not on file  ?Social Connections: Not on file  ? ? ?Family History  ?Problem Relation Age of Onset  ? Hypertension Mother   ? Diabetes Mother   ? Hyperlipidemia Mother   ? Heart attack Mother   ? Hypertension Father   ? Stroke Father   ? ? ?Outpatient Encounter Medications as of 01/21/2022  ?Medication Sig  ? acetaminophen (TYLENOL) 500 MG tablet Take by mouth.  ? atorvastatin (LIPITOR) 20 MG tablet Take 20 mg by mouth daily.  ? citalopram (CELEXA) 10 MG tablet Take 10 mg by mouth daily.  ? fluconazole (DIFLUCAN) 150 MG tablet  Take 150 mg by mouth every morning.  ? glucose blood (ONETOUCH VERIO) test strip Use to test blood glucose 3 times daily.  ? levothyroxine (SYNTHROID) 50 MCG tablet Take 25 mcg by mouth every morning.  ? losartan (COZAAR) 100 MG tablet Take 100 mg by mouth daily.  ? metFORMIN (GLUCOPHAGE) 500 MG tablet Take 500 mg by mouth daily.  ? warfarin (COUMADIN) 5 MG tablet Take by mouth. Takes 2 1/2 every day except on Wednesdays and Sundays only takes 65m  ? triamcinolone cream (KENALOG) 0.1 % SMARTSIG:Sparingly Topical Twice Daily (Patient not taking: Reported on 01/21/2022)  ? [DISCONTINUED] moxifloxacin (VIGAMOX) 0.5 % ophthalmic solution Apply to eye. (Patient not taking: Reported on 01/21/2022)  ? ?  No facility-administered encounter medications on file as of 01/21/2022.  ? ? ?ALLERGIES: ?Allergies  ?Allergen Reactions  ? Penicillins Swelling  ? Sufentanil Swelling  ? Sulfa Antibiotics Rash and Swelling  ? ?VACCINATION STATUS: ? ?There is no immunization history on file for this patient. ? ? ?HPI  ?Diabetes ?She presents for her follow-up diabetic visit. She has type 2 diabetes mellitus. Onset time: Diagnosed at approx age of 34. Her disease course has been stable. There are no hypoglycemic associated symptoms. Pertinent negatives for hypoglycemia include no tremors. Associated symptoms include fatigue. Pertinent negatives for diabetes include no weight loss. There are no hypoglycemic complications. Symptoms are improving. Diabetic complications include nephropathy. Risk factors for coronary artery disease include diabetes mellitus, dyslipidemia, family history, hypertension, post-menopausal, sedentary lifestyle and stress. Current diabetic treatment includes oral agent (monotherapy). She is compliant with treatment most of the time. Her weight is fluctuating minimally. She is following a generally healthy diet. When asked about meal planning, she reported none. She has not had a previous visit with a dietitian. She  participates in exercise intermittently. Her breakfast blood glucose range is generally 110-130 mg/dl. Her bedtime blood glucose range is generally 140-180 mg/dl. (She presents today with her meter and logs showing at target glycemic profile overall. She was not due for another A1c today.  She feels much better since her medications were changed at last visit.  She denies any hypoglycemia.  She is eating healthier.) An ACE inhibitor/angiotensin II receptor blocker is being taken. She does not see a podiatrist.Eye exam is current.  ?Thyroid Problem ?Presents for follow-up visit. Symptoms include fatigue. Patient reports no constipation, depressed mood, hair loss, leg swelling, tremors, weight gain or weight loss. The symptoms have been improving.  ? ?Brandi [name]  is a patient with the above medical history. she was diagnosed with hypothyroidism at approximate age of 61 years, which required subsequent initiation of thyroid hormone replacement. she was given various doses of Levothyroxine over the years, currently on 25 (lowered between visits due to lab results) micrograms. She was off this medication briefly when she was sick back in January.  she reports compliance to this medication, now taking it properly. ? ?I reviewed patient's thyroid tests: ? ?Lab Results  ?Component Value Date  ? TSH <0.005 (L) 12/16/2021  ? TSH 0.01 (A) 09/11/2021  ? FREET4 2.91 (H) 12/16/2021  ?  ?Pt denies feeling nodules in neck, hoarseness, dysphagia/odynophagia, SOB with lying down. ? ?she does have family history of thyroid disorders in her sister (hypothyroidism).  No family history of thyroid cancer.  ?No history of radiation therapy to head or neck.  No recent use of iodine supplements.  Denies use of Biotin containing supplements. ? ?I reviewed her chart and she also has a history of diabetes (which she would like me to start taking care of as well), Factor V Leiden Mutation (on blood thinners). ? ? ?Review of  systems ? ?Constitutional: + Minimally fluctuating body weight,  current Body mass index is 30.91 kg/m?. , no fatigue, no subjective hyperthermia, no subjective hypothermia ?Eyes: no blurry vision, no xerophthalmia ?ENT: no sore throat, no nodules palpated in throat, no dysphagia/odynophagia, no hoarseness ?Cardiovascular: no chest pain, no shortness of breath, no palpitations, no leg swelling ?Respiratory: no cough, no shortness of breath ?Gastrointestinal: no nausea/vomiting/diarrhea ?Musculoskeletal: no muscle/joint aches ?Skin: no rashes, no hyperemia ?Neurological: no tremors, no numbness, no tingling, no dizziness ?Psychiatric: no depression, no anxiety ? ? ?Objective:  ? ?Objective   ? ? ?  BP (!) 146/61   Pulse (!) 50   Ht 5' 0.25" (1.53 m)   Wt 159 lb 9.6 oz (72.4 kg)   SpO2 99%   BMI 30.91 kg/m?  ?Wt Readings from Last 3 Encounters:  ?01/21/22 159 lb 9.6 oz (72.4 kg)  ?12/23/21 159 lb 3.2 oz (72.2 kg)  ?12/16/21 158 lb 12.8 oz (72 kg)  ? ? ?BP Readings from Last 3 Encounters:  ?01/21/22 (!) 146/61  ?12/23/21 112/65  ?12/16/21 (!) 99/56  ?  ? ?Physical Exam- Limited ? ?Constitutional:  Body mass index is 30.91 kg/m?. , not in acute distress, normal state of mind ?Eyes:  EOMI, no exophthalmos ?Neck: Supple ?Cardiovascular: RRR, no murmurs, rubs, or gallops, no edema ?Respiratory: Adequate breathing efforts, no crackles, rales, rhonchi, or wheezing ?Musculoskeletal: no gross deformities, strength intact in all four extremities, no gross restriction of joint movements ?Skin:  no rashes, no hyperemia ?Neurological: no tremor with outstretched hands ? ? ?CMP ( most recent) ?CMP  ?   ?Component Value Date/Time  ? NA 139 12/02/2021 0000  ? K 4.3 12/02/2021 0000  ? CL 106 12/02/2021 0000  ? CO2 25 (A) 12/02/2021 0000  ? BUN 22 (A) 12/02/2021 0000  ? CREATININE 10.7 (A) 12/02/2021 0000  ? CREATININE 0.89 02/16/2009 0905  ? CALCIUM 9.4 12/02/2021 0000  ? GFRNONAA >60 02/16/2009 0905  ? GFRAA  02/16/2009 0905  ?   >60        ?The eGFR has been calculated ?using the MDRD equation. ?This calculation has not been ?validated in all clinical ?situations. ?eGFR's persistently ?<60 mL/min signify ?possible Chronic Kidney Disease.  ? ? ? ?[place]

## 2022-01-21 NOTE — Patient Instructions (Signed)

## 2022-03-26 LAB — HEMOGLOBIN A1C: Hemoglobin A1C: 6.5

## 2022-04-18 ENCOUNTER — Telehealth: Payer: Self-pay

## 2022-04-18 LAB — T4, FREE: Free T4: 3.22 ng/dL — ABNORMAL HIGH (ref 0.82–1.77)

## 2022-04-18 LAB — TSH: TSH: <0.005 u[IU]/mL — ABNORMAL LOW (ref 0.450–4.500)

## 2022-04-18 NOTE — Telephone Encounter (Signed)
No answer. No VM. Will Call back before end of day

## 2022-04-18 NOTE — Telephone Encounter (Signed)
-----   Message from Brita Romp, NP sent at 04/18/2022  7:34 AM EDT ----- Regarding: FW: Her labs are suggestive of over replacement and she's likely experiencing symptoms. Is she still taking the 25 mcg dose?  If so, have her split it in half and then repeat labs in 5 weeks (TSH and Ft4) ----- Message ----- From: Interface, [facility] Lab Results In Sent: 04/18/2022   5:37 AM EDT To: Brita Romp, NP

## 2022-04-21 ENCOUNTER — Other Ambulatory Visit: Payer: Self-pay | Admitting: Nurse Practitioner

## 2022-04-22 NOTE — Progress Notes (Signed)
Attempted to contact x 3 no answer. No VM. Will send letter.

## 2022-04-22 NOTE — Telephone Encounter (Signed)
No answer. No VM X 3. Will mail letter

## 2022-04-22 NOTE — Patient Instructions (Signed)

## 2022-04-23 ENCOUNTER — Ambulatory Visit: Payer: Medicare HMO | Admitting: Nurse Practitioner

## 2022-04-23 ENCOUNTER — Encounter: Payer: Self-pay | Admitting: Nurse Practitioner

## 2022-04-23 VITALS — BP 140/62 | HR 52 | Ht 60.25 in | Wt 155.0 lb

## 2022-04-23 DIAGNOSIS — E063 Autoimmune thyroiditis: Secondary | ICD-10-CM

## 2022-04-23 DIAGNOSIS — N1831 Chronic kidney disease, stage 3a: Secondary | ICD-10-CM

## 2022-04-23 DIAGNOSIS — E782 Mixed hyperlipidemia: Secondary | ICD-10-CM | POA: Diagnosis not present

## 2022-04-23 DIAGNOSIS — E559 Vitamin D deficiency, unspecified: Secondary | ICD-10-CM

## 2022-04-23 DIAGNOSIS — E1122 Type 2 diabetes mellitus with diabetic chronic kidney disease: Secondary | ICD-10-CM

## 2022-04-23 DIAGNOSIS — I10 Essential (primary) hypertension: Secondary | ICD-10-CM

## 2022-04-23 DIAGNOSIS — E038 Other specified hypothyroidism: Secondary | ICD-10-CM

## 2022-04-23 NOTE — Progress Notes (Signed)
Endocrinology Follow Up Note                                         04/23/2022, 4:46 PM  Subjective:   Subjective    Brandi [name] is a 76 y.o.-year-old female patient being seen in follow up after being seen in consultation for hypothyroidism referred by Ephriam Jenkins E.  She would also like to be seen here for her diabetes management as well.   Past Medical History:  Diagnosis Date   Cancer ([place])    Diabetes mellitus ([place])    Hyperlipidemia    Hypertension    Thyroid disease     Past Surgical History:  Procedure Laterality Date   CYST EXCISION     1990    Social History   Socioeconomic History   Marital status: Married    Spouse name: Not on file   Number of children: Not on file   Years of education: Not on file   Highest education level: Not on file  Occupational History   Not on file  Tobacco Use   Smoking status: Never    Passive exposure: Past   Smokeless tobacco: Never  Vaping Use   Vaping Use: Never used  Substance and Sexual Activity   Alcohol use: Yes   Drug use: Never   Sexual activity: Not on file  Other Topics Concern   Not on file  Social History Narrative   Not on file   Social Determinants of Health   Financial Resource Strain: Not on file  Food Insecurity: Not on file  Transportation Needs: Not on file  Physical Activity: Not on file  Stress: Not on file  Social Connections: Not on file    Family History  Problem Relation Age of Onset   Hypertension Mother    Diabetes Mother    Hyperlipidemia Mother    Heart attack Mother    Hypertension Father    Stroke Father     Outpatient Encounter Medications as of 04/23/2022  Medication Sig   acetaminophen (TYLENOL) 500 MG tablet Take by mouth.   atorvastatin (LIPITOR) 20 MG tablet Take 20 mg by mouth daily.   citalopram (CELEXA) 10 MG tablet Take 10 mg by mouth daily.   losartan (COZAAR) 100 MG tablet Take  100 mg by mouth daily.   ONETOUCH VERIO test strip USE TO TEST BLOOD GLUCOSE 3 TIMES DAILY   warfarin (COUMADIN) 5 MG tablet Take by mouth. Takes 2 1/2 every day except on Wednesdays and Sundays only takes 72m   [DISCONTINUED] fluconazole (DIFLUCAN) 150 MG tablet Take 150 mg by mouth every morning.   [DISCONTINUED] levothyroxine (SYNTHROID) 50 MCG tablet Take 25 mcg by mouth every morning.   [DISCONTINUED] metFORMIN (GLUCOPHAGE) 500 MG tablet Take 500 mg by mouth daily.   [DISCONTINUED] triamcinolone cream (KENALOG) 0.1 % SMARTSIG:Sparingly Topical Twice Daily (Patient not taking: Reported on 01/21/2022)   No facility-administered encounter medications on file as of 04/23/2022.    ALLERGIES: Allergies  Allergen  Reactions   Penicillins Swelling   Sufentanil Swelling   Sulfa Antibiotics Rash and Swelling   VACCINATION STATUS:  There is no immunization history on file for this patient.   HPI  Diabetes She presents for her follow-up diabetic visit. She has type 2 diabetes mellitus. Onset time: Diagnosed at approx age of 65. Her disease course has been stable. There are no hypoglycemic associated symptoms. Pertinent negatives for hypoglycemia include no tremors. Associated symptoms include fatigue and weight loss. There are no hypoglycemic complications. Symptoms are stable. Diabetic complications include nephropathy. Risk factors for coronary artery disease include diabetes mellitus, dyslipidemia, family history, hypertension, post-menopausal, sedentary lifestyle and stress. Current diabetic treatment includes oral agent (monotherapy). She is compliant with treatment most of the time. Her weight is decreasing steadily. She is following a generally healthy diet. When asked about meal planning, she reported none. She has not had a previous visit with a dietitian. She participates in exercise intermittently. Her home blood glucose trend is fluctuating minimally. Her breakfast blood glucose range is  generally 130-140 mg/dl. (She presents today with her logs, no meter, showing at goal fasting glycemic profile.  Her POCT A1c today is 6.5%.  She denies any s/s of hypoglycemia.  She continues to eat healthy.) An ACE inhibitor/angiotensin II receptor blocker is being taken. She does not see a podiatrist.Eye exam is current.  Thyroid Problem Presents for follow-up visit. Symptoms include fatigue, palpitations and weight loss. Patient reports no constipation, depressed mood, hair loss, leg swelling, tremors or weight gain. The symptoms have been improving.    Brandi [name]  is a patient with the above medical history. she was diagnosed with hypothyroidism at approximate age of 63 years, which required subsequent initiation of thyroid hormone replacement. she was given various doses of Levothyroxine over the years, currently on 25 (lowered between visits due to lab results) micrograms. She was off this medication briefly when she was sick back in January.  she reports compliance to this medication, now taking it properly.  I reviewed patient's thyroid tests:  Lab Results  Component Value Date   TSH <0.005 (L) 04/17/2022   TSH <0.005 (L) 12/16/2021   TSH 0.01 (A) 09/11/2021   FREET4 3.22 (H) 04/17/2022   FREET4 2.91 (H) 12/16/2021    Pt denies feeling nodules in neck, hoarseness, dysphagia/odynophagia, SOB with lying down.  she does have family history of thyroid disorders in her sister (hypothyroidism).  No family history of thyroid cancer.  No history of radiation therapy to head or neck.  No recent use of iodine supplements.  Denies use of Biotin containing supplements.  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   Review of systems  Constitutional: + steadily decreasing body weight,  current Body mass index is 30.02 kg/m. , no fatigue, no subjective hyperthermia,  no subjective hypothermia Eyes: no blurry vision, no xerophthalmia ENT: no sore throat, no nodules palpated in throat, no dysphagia/odynophagia, no hoarseness Cardiovascular: no chest pain, no shortness of breath, intermittent palpitations, no leg swelling Respiratory: no cough, no shortness of breath Gastrointestinal: no nausea/vomiting/diarrhea Musculoskeletal: no muscle/joint aches Skin: no rashes, no hyperemia Neurological: no tremors, no numbness, no tingling, no dizziness Psychiatric: no depression, no anxiety   Objective:   Objective     BP 140/62   Pulse (!) 52   Ht 5' 0.25" (1.53 m)  Wt 155 lb (70.3 kg)   BMI 30.02 kg/m  Wt Readings from Last 3 Encounters:  04/23/22 155 lb (70.3 kg)  01/21/22 159 lb 9.6 oz (72.4 kg)  12/23/21 159 lb 3.2 oz (72.2 kg)    BP Readings from Last 3 Encounters:  04/23/22 140/62  01/21/22 (!) 146/61  12/23/21 112/65      Physical Exam- Limited  Constitutional:  Body mass index is 30.02 kg/m. , not in acute distress, normal state of mind Eyes:  EOMI, no exophthalmos Neck: Supple Cardiovascular: RRR, no murmurs, rubs, or gallops, no edema Respiratory: Adequate breathing efforts, no crackles, rales, rhonchi, or wheezing Musculoskeletal: no gross deformities, strength intact in all four extremities, no gross restriction of joint movements Skin:  no rashes, no hyperemia Neurological: no tremor with outstretched hands   CMP ( most recent) CMP     Component Value Date/Time   NA 139 12/02/2021 0000   K 4.3 12/02/2021 0000   CL 106 12/02/2021 0000   CO2 25 (A) 12/02/2021 0000   BUN 22 (A) 12/02/2021 0000   CREATININE 10.7 (A) 12/02/2021 0000   CREATININE 0.89 02/16/2009 0905   CALCIUM 9.4 12/02/2021 0000   GFRNONAA >60 02/16/2009 0905   GFRAA  02/16/2009 0905    >60        The eGFR has been calculated using the MDRD equation. This calculation has not been validated in all clinical situations. eGFR's persistently <60  mL/min signify possible Chronic Kidney Disease.     Diabetic Labs (most recent): Lab Results  Component Value Date   HGBA1C 6.5 03/26/2022   HGBA1C 6.7 12/02/2021   MICROALBUR 30 01/21/2022     Lipid Panel ( most recent) Lipid Panel     Component Value Date/Time   CHOL 131 12/02/2021 0000   TRIG 186 (A) 12/02/2021 0000   HDL 37 12/02/2021 0000   LDLCALC 57 12/02/2021 0000       Lab Results  Component Value Date   TSH <0.005 (L) 04/17/2022   TSH <0.005 (L) 12/16/2021   TSH 0.01 (A) 09/11/2021   FREET4 3.22 (H) 04/17/2022   FREET4 2.91 (H) 12/16/2021      Assessment & Plan:   ASSESSMENT / PLAN:  1) Hypothyroidism-r/t Hashimoto's thyroiditis Her antibody testing was positive, indicating she has autoimmune thyroid dysfunction.  Her recent TFTs are suggestive of over-replacement (even on the lowest dose).  She is advised to stop this medication for now.  Will recheck TFTs prior to next visit.  -Due to absence of clinical goiter, no need for thyroid ultrasound.   2) Type 2 Diabetes  She presents today with her logs, no meter, showing at goal fasting glycemic profile.  Her POCT A1c today is 6.5%.  She denies any s/s of hypoglycemia.  She continues to eat healthy.  - Alandria has currently uncontrolled symptomatic type 2 DM since 76 years of age.  -Recent labs reviewed.  - I had a long discussion with her about the progressive nature of diabetes and the pathology behind its complications. 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 These are all discussed in detail with her.  The following Lifestyle Medicine recommendations according to [facility]) were discussed and offered to patient and she agrees to start the journey:  A. Whole Foods, Plant-based plate comprising of fruits and vegetables, plant-based  proteins, whole-grain  carbohydrates was discussed in detail with the patient.   A list for source of those nutrients were also provided to the patient.  Patient will use only water or unsweetened tea for hydration. B.  The need to stay away from risky substances including alcohol, smoking; obtaining 7 to 9 hours of restorative sleep, at least 150 minutes of moderate intensity exercise weekly, the importance of healthy social connections,  and stress reduction techniques were discussed. C.  A full color page of  Calorie density of various food groups per pound showing examples of each food groups was provided to the patient.  - I have counseled her on diet and weight management by adopting a carbohydrate restricted/protein rich diet. Patient is encouraged to switch to unprocessed or minimally processed complex starch and increased protein intake (animal or plant source), fruits, and vegetables. -  She is advised to stick to a routine mealtimes to eat 3 meals a day and avoid unnecessary snacks (to snack only to correct hypoglycemia).   - She acknowledges that there is a room for improvement in her food and drink choices. - Suggestion is made for her to avoid simple carbohydrates from her diet including Cakes, Sweet Desserts, Ice Cream, Soda (diet and regular), Sweet Tea, Candies, Chips, Cookies, Store Bought Juices, Alcohol in Excess of 1-2 drinks a day, Artificial Sweeteners, Coffee Creamer, and "Sugar-free" Products. This will help patient to have more stable blood glucose profile and potentially avoid unintended weight gain.  - I have approached her with the following individualized plan to manage her diabetes and patient agrees:   -Given her improved glycemic profile and healthier lifestyle, her Metformin will be discontinued and she will be given a chance to continue progress without it.  She prefers to continue monitoring her glucose at least once daily for awareness.  - Adjustment parameters are  given to her for hypo and hyperglycemia in writing.  - Specific targets for  A1c; LDL, HDL, and Triglycerides were discussed with the patient.  3) Blood Pressure /Hypertension: Her blood pressure is controlled to target.   She is advised to continue current medications including Losartan 100 mg p.o. daily with breakfast.    4) Lipids/Hyperlipidemia:    Review of her recent lipid panel from 09/11/21 showed controlled LDL at 57 and slightly elevated triglycerides of 186.  She is advised to continue Lipitor 20 mg daily at bedtime.  Side effects and precautions discussed with her.  5)  Weight/Diet:  Her Body mass index is 30.02 kg/m.  - She is Not a candidate for weight loss.  Exercise, and detailed carbohydrates information provided  -  detailed on discharge instructions.  6) Chronic Care/Health Maintenance: -She is on ACEI/ARB and Statin medications and is encouraged to initiate and continue to follow up with Ophthalmology, Dentist, Podiatrist at least yearly or according to recommendations, and advised to stay away from smoking. I have recommended yearly flu vaccine and pneumonia vaccine at least every 5 years; moderate intensity exercise for up to 150 minutes weekly; and sleep for at least 7 hours a day.  - She is advised to maintain close follow up with Ephriam Jenkins for primary care needs, as well as her other providers for optimal and coordinated care.      I spent 40 minutes in the care of the patient today including review of labs from [place], Lipids, Thyroid Function, Hematology (current and previous including abstractions from other facilities); face-to-face time discussing  her blood glucose readings/logs, discussing hypoglycemia and  hyperglycemia episodes and symptoms, medications doses, her options of short and long term treatment based on the latest standards of care / guidelines;  discussion about incorporating lifestyle medicine;  and documenting the encounter.    Please refer to  Patient Instructions for Blood Glucose Monitoring and Insulin/Medications Dosing Guide"  in media tab for additional information. Please  also refer to " Patient Self Inventory" in the Media  tab for reviewed elements of pertinent patient history.  Brandi [name] participated in the discussions, expressed understanding, and voiced agreement with the above plans.  All questions were answered to her satisfaction. she is encouraged to contact clinic should she have any questions or concerns prior to her return visit.   FOLLOW UP PLAN:  Return in about 8 weeks (around 06/18/2022) for Thyroid follow up, Previsit labs.  Rayetta Pigg, [facility] [address] [place], [place] [address] Phone: [phone] Fax: [phone]  04/23/2022, 4:46 PM

## 2022-06-16 LAB — TSH: TSH: 0.01 — AB (ref 0.41–5.90)

## 2022-06-17 ENCOUNTER — Encounter: Payer: Self-pay | Admitting: Nurse Practitioner

## 2022-06-18 ENCOUNTER — Encounter: Payer: Self-pay | Admitting: Nurse Practitioner

## 2022-06-18 ENCOUNTER — Ambulatory Visit: Payer: Medicare HMO | Admitting: Nurse Practitioner

## 2022-06-18 VITALS — BP 133/68 | HR 56 | Ht 60.25 in | Wt 157.0 lb

## 2022-06-18 DIAGNOSIS — E559 Vitamin D deficiency, unspecified: Secondary | ICD-10-CM

## 2022-06-18 DIAGNOSIS — E1122 Type 2 diabetes mellitus with diabetic chronic kidney disease: Secondary | ICD-10-CM | POA: Diagnosis not present

## 2022-06-18 DIAGNOSIS — N1831 Chronic kidney disease, stage 3a: Secondary | ICD-10-CM

## 2022-06-18 DIAGNOSIS — E038 Other specified hypothyroidism: Secondary | ICD-10-CM | POA: Diagnosis not present

## 2022-06-18 DIAGNOSIS — E0590 Thyrotoxicosis, unspecified without thyrotoxic crisis or storm: Secondary | ICD-10-CM

## 2022-06-18 DIAGNOSIS — E063 Autoimmune thyroiditis: Secondary | ICD-10-CM

## 2022-06-18 DIAGNOSIS — E782 Mixed hyperlipidemia: Secondary | ICD-10-CM | POA: Diagnosis not present

## 2022-06-18 DIAGNOSIS — I10 Essential (primary) hypertension: Secondary | ICD-10-CM

## 2022-06-18 LAB — POCT GLYCOSYLATED HEMOGLOBIN (HGB A1C): HbA1c POC (<> result, manual entry): 7.1 % (ref 4.0–5.6)

## 2022-06-18 MED ORDER — PREDNISONE 10 MG PO TABS: 10.0000 mg | ORAL_TABLET | Freq: Every day | ORAL | 0 refills | Status: DC

## 2022-06-18 NOTE — Progress Notes (Signed)
Endocrinology Follow Up Note                                         06/18/2022, 3:07 PM  Subjective:   Subjective    Brandi [name] is a 76 y.o.-year-old female patient being seen in follow up after being seen in consultation for hypothyroidism referred by Ephriam Jenkins E.  She would also like to be seen here for her diabetes management as well.   Past Medical History:  Diagnosis Date   Cancer ([place])    Diabetes mellitus ([place])    Hyperlipidemia    Hypertension    Thyroid disease     Past Surgical History:  Procedure Laterality Date   CYST EXCISION     1990    Social History   Socioeconomic History   Marital status: Married    Spouse name: Not on file   Number of children: Not on file   Years of education: Not on file   Highest education level: Not on file  Occupational History   Not on file  Tobacco Use   Smoking status: Never    Passive exposure: Past   Smokeless tobacco: Never  Vaping Use   Vaping Use: Never used  Substance and Sexual Activity   Alcohol use: Yes   Drug use: Never   Sexual activity: Not on file  Other Topics Concern   Not on file  Social History Narrative   Not on file   Social Determinants of Health   Financial Resource Strain: Not on file  Food Insecurity: Not on file  Transportation Needs: Not on file  Physical Activity: Not on file  Stress: Not on file  Social Connections: Not on file    Family History  Problem Relation Age of Onset   Hypertension Mother    Diabetes Mother    Hyperlipidemia Mother    Heart attack Mother    Hypertension Father    Stroke Father     Outpatient Encounter Medications as of 06/18/2022  Medication Sig   predniSONE (DELTASONE) 10 MG tablet Take 1 tablet (10 mg total) by mouth daily with breakfast.   acetaminophen (TYLENOL) 500 MG tablet Take by mouth.   atorvastatin (LIPITOR) 20 MG tablet Take 20 mg by mouth daily.    citalopram (CELEXA) 10 MG tablet Take 10 mg by mouth daily.   losartan (COZAAR) 100 MG tablet Take 100 mg by mouth daily.   ONETOUCH VERIO test strip USE TO TEST BLOOD GLUCOSE 3 TIMES DAILY   warfarin (COUMADIN) 5 MG tablet Take by mouth. Takes 2 1/2 every day except on Wednesdays and Sundays only takes 63m   No facility-administered encounter medications on file as of 06/18/2022.    ALLERGIES: Allergies  Allergen Reactions   Penicillins Swelling   Sufentanil Swelling   Sulfa Antibiotics Rash and Swelling   VACCINATION STATUS:  There is no immunization history on file for this patient.   HPI  Diabetes She presents for her follow-up diabetic visit. She  has type 2 diabetes mellitus. Onset time: Diagnosed at approx age of 85. Her disease course has been stable. There are no hypoglycemic associated symptoms. Pertinent negatives for hypoglycemia include no tremors. Associated symptoms include fatigue and weight loss. There are no hypoglycemic complications. Symptoms are stable. Diabetic complications include nephropathy. Risk factors for coronary artery disease include diabetes mellitus, dyslipidemia, family history, hypertension, post-menopausal, sedentary lifestyle and stress. Current diabetic treatment includes oral agent (monotherapy). She is compliant with treatment most of the time. Her weight is decreasing steadily. She is following a generally healthy diet. When asked about meal planning, she reported none. She has not had a previous visit with a dietitian. She participates in exercise intermittently. Her home blood glucose trend is fluctuating minimally. Her breakfast blood glucose range is generally 130-140 mg/dl. (She presents today with no meter or logs.  Her POCT A1c today is 7.1%.  She denies any s/s of hypoglycemia.  She continues to eat healthy but has splurged over the summer.) An ACE inhibitor/angiotensin II receptor blocker is being taken. She does not see a podiatrist.Eye exam is  current.  Thyroid Problem Presents for follow-up visit. Symptoms include fatigue, palpitations and weight loss. Patient reports no constipation, depressed mood, hair loss, leg swelling, tremors or weight gain. The symptoms have been improving.    Brandi [name]  is a patient with the above medical history. she was diagnosed with hypothyroidism at approximate age of 7 years, which required subsequent initiation of thyroid hormone replacement. she was given various doses of Levothyroxine over the years, currently on 25 (lowered between visits due to lab results) micrograms. She was off this medication last visit due to signs of over-replacement.  I reviewed patient's thyroid tests:  Lab Results  Component Value Date   TSH 0.01 (A) 06/16/2022   TSH <0.005 (L) 04/17/2022   TSH <0.005 (L) 12/16/2021   TSH 0.01 (A) 09/11/2021   FREET4 3.22 (H) 04/17/2022   FREET4 2.91 (H) 12/16/2021    Pt denies feeling nodules in neck, hoarseness, dysphagia/odynophagia, SOB with lying down.  she does have family history of thyroid disorders in her sister (hypothyroidism).  No family history of thyroid cancer.  No history of radiation therapy to head or neck.  No recent use of iodine supplements.  Denies use of Biotin containing supplements.  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   Review of systems  Constitutional: + steadily decreasing body weight,  current Body mass index is 30.41 kg/m. , + fatigue, + subjective hyperthermia, no subjective hypothermia Eyes: no blurry vision, no xerophthalmia ENT: no sore throat, no nodules palpated in throat, no dysphagia/odynophagia, no hoarseness Cardiovascular: no chest pain, no shortness of breath, intermittent palpitations, no leg swelling Respiratory: no cough, no shortness of breath Gastrointestinal: no nausea/vomiting, +  diarrhea Musculoskeletal: no muscle/joint aches Skin: no rashes, no hyperemia Neurological: no tremors, no numbness, no tingling, no dizziness Psychiatric: no depression, + anxiety   Objective:   Objective     BP 133/68   Pulse (!) 56   Ht 5' 0.25" (1.53 m)   Wt 157 lb (71.2 kg)   BMI 30.41 kg/m  Wt Readings from Last 3 Encounters:  06/18/22 157 lb (71.2 kg)  04/23/22 155 lb (70.3 kg)  01/21/22 159 lb 9.6 oz (72.4 kg)    BP Readings from Last 3 Encounters:  06/18/22 133/68  04/23/22 140/62  01/21/22 (!) 146/61      Physical Exam- Limited  Constitutional:  Body mass index is 30.41 kg/m. , not in acute distress, normal state of mind Eyes:  EOMI, no exophthalmos Neck: Supple Cardiovascular: RRR, no murmurs, rubs, or gallops, no edema Respiratory: Adequate breathing efforts, no crackles, rales, rhonchi, or wheezing Musculoskeletal: no gross deformities, strength intact in all four extremities, no gross restriction of joint movements Skin:  no rashes, no hyperemia Neurological: no tremor with outstretched hands   CMP ( most recent) CMP     Component Value Date/Time   NA 139 12/02/2021 0000   K 4.3 12/02/2021 0000   CL 106 12/02/2021 0000   CO2 25 (A) 12/02/2021 0000   BUN 22 (A) 12/02/2021 0000   CREATININE 10.7 (A) 12/02/2021 0000   CREATININE 0.89 02/16/2009 0905   CALCIUM 9.4 12/02/2021 0000   GFRNONAA >60 02/16/2009 0905   GFRAA  02/16/2009 0905    >60        The eGFR has been calculated using the MDRD equation. This calculation has not been validated in all clinical situations. eGFR's persistently <60 mL/min signify possible Chronic Kidney Disease.     Diabetic Labs (most recent): Lab Results  Component Value Date   HGBA1C 7.1 06/18/2022   HGBA1C 6.5 03/26/2022   HGBA1C 6.7 12/02/2021   MICROALBUR 30 01/21/2022     Lipid Panel ( most recent) Lipid Panel     Component Value Date/Time   CHOL 131 12/02/2021 0000   TRIG 186 (A)  12/02/2021 0000   HDL 37 12/02/2021 0000   LDLCALC 57 12/02/2021 0000       Lab Results  Component Value Date   TSH 0.01 (A) 06/16/2022   TSH <0.005 (L) 04/17/2022   TSH <0.005 (L) 12/16/2021   TSH 0.01 (A) 09/11/2021   FREET4 3.22 (H) 04/17/2022   FREET4 2.91 (H) 12/16/2021     Latest Reference Range & Units 12/16/21 14:24 04/17/22 14:59 06/16/22 00:00  TSH 0.41 - 5.90  <0.005 (L) <0.005 (L) 0.01 ! (E)  T4,Free(Direct) 0.82 - 1.77 ng/dL 2.91 (H) 3.22 (H)   Thyroperoxidase Ab SerPl-aCnc 0 - 34 IU/mL 323 (H)    Thyroglobulin Antibody 0.0 - 0.9 IU/mL 7.5 (H)    (L): Data is abnormally low !: Data is abnormal (H): Data is abnormally high (E): External lab result  Assessment & Plan:   ASSESSMENT / PLAN:  1) Hypothyroidism-r/t Hashimoto's thyroiditis Her antibody testing was positive, indicating she has autoimmune thyroid dysfunction.  Her repeat thyroid function tests are still consistent with hyperthyroidism, sometimes seen in acute thyroiditis.  She does have symptoms of such.  Will treat her with oral Prednisone 10 mg po x 15 days then repeat thyroid labs in 5 weeks, follow up in 6 weeks.  I considered using Methimazole for her but due to her being on Coumadin and the effects it can have on her levels, decided to try Prednisone.  We also talked about potentially needing uptake and scan in the future and possibly radioactive iodine ablation.  -Due to absence of clinical goiter, no need for thyroid ultrasound.   2) Type 2 Diabetes  She presents today with no meter or logs.  Her POCT A1c today is 7.1%.  She denies any s/s of hypoglycemia.  She continues to eat healthy but has splurged over the summer.  - Brandi [name] has currently uncontrolled symptomatic type 2 DM since 76 years of age.  -Recent  labs reviewed.  - I had a long discussion with her about the progressive nature of diabetes and the pathology behind its complications. 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 These are all discussed in detail with her.  The following Lifestyle Medicine recommendations according to [facility]) were discussed and offered to patient and she agrees to start the journey:  A. Whole Foods, Plant-based plate comprising of fruits and vegetables, plant-based proteins, whole-grain carbohydrates was discussed in detail with the patient.   A list for source of those nutrients were also provided to the patient.  Patient will use only water or unsweetened tea for hydration. B.  The need to stay away from risky substances including alcohol, smoking; obtaining 7 to 9 hours of restorative sleep, at least 150 minutes of moderate intensity exercise weekly, the importance of healthy social connections,  and stress reduction techniques were discussed. C.  A full color page of  Calorie density of various food groups per pound showing examples of each food groups was provided to the patient.  - Nutritional counseling repeated at each appointment due to patients tendency to fall back in to old habits.  - The patient admits there is a room for improvement in their diet and drink choices. -  Suggestion is made for the patient to avoid simple carbohydrates from their diet including Cakes, Sweet Desserts / Pastries, Ice Cream, Soda (diet and regular), Sweet Tea, Candies, Chips, Cookies, Sweet Pastries, Store Bought Juices, Alcohol in Excess of 1-2 drinks a day, Artificial Sweeteners, Coffee Creamer, and "Sugar-free" Products. This will help patient to have stable blood glucose profile and potentially avoid unintended weight gain.   - I encouraged the patient to switch to unprocessed or minimally processed complex starch and increased protein intake (animal or plant source), fruits, and vegetables.   - Patient is advised to stick to a routine  mealtimes to eat 3 meals a day and avoid unnecessary snacks (to snack only to correct hypoglycemia).  - I have approached her with the following individualized plan to manage her diabetes and patient agrees:   -Given her improved glycemic profile and healthier lifestyle, her Metformin will be discontinued and she will be given a chance to continue progress without it.  She prefers to continue monitoring her glucose at least once daily for awareness.  - Adjustment parameters are given to her for hypo and hyperglycemia in writing.  - Specific targets for  A1c; LDL, HDL, and Triglycerides were discussed with the patient.  3) Blood Pressure /Hypertension: Her blood pressure is controlled to target.   She is advised to continue current medications including Losartan 100 mg p.o. daily with breakfast.    4) Lipids/Hyperlipidemia:    Review of her recent lipid panel from 12/02/21 showed controlled LDL at 57.  She is advised to continue Lipitor 20 mg daily at bedtime.  Side effects and precautions discussed with her.  5)  Weight/Diet:  Her Body mass index is 30.41 kg/m.  - She is Not a candidate for weight loss.  Exercise, and detailed carbohydrates information provided  -  detailed on discharge instructions.  6) Chronic Care/Health Maintenance: -She is on ACEI/ARB and Statin medications and is encouraged to initiate and continue to follow up with Ophthalmology, Dentist, Podiatrist at least yearly or according to recommendations, and advised to stay away from smoking.  I have recommended yearly flu vaccine and pneumonia vaccine at least every 5 years; moderate intensity exercise for up to 150 minutes weekly; and sleep for at least 7 hours a day.  - She is advised to maintain close follow up with Ephriam Jenkins for primary care needs, as well as her other providers for optimal and coordinated care.      I spent 42 minutes in the care of the patient today including review of labs from [place], Lipids,  Thyroid Function, Hematology (current and previous including abstractions from other facilities); face-to-face time discussing  her blood glucose readings/logs, discussing hypoglycemia and hyperglycemia episodes and symptoms, medications doses, her options of short and long term treatment based on the latest standards of care / guidelines;  discussion about incorporating lifestyle medicine;  and documenting the encounter. Risk reduction counseling performed per USPSTF guidelines to reduce obesity and cardiovascular risk factors.     Please refer to Patient Instructions for Blood Glucose Monitoring and Insulin/Medications Dosing Guide"  in media tab for additional information. Please  also refer to " Patient Self Inventory" in the Media  tab for reviewed elements of pertinent patient history.  Brandi [name] participated in the discussions, expressed understanding, and voiced agreement with the above plans.  All questions were answered to her satisfaction. she is encouraged to contact clinic should she have any questions or concerns prior to her return visit.   FOLLOW UP PLAN:  Return in about 6 weeks (around 07/30/2022) for Thyroid follow up, Previsit labs, Virtual visit ok.  Rayetta Pigg, [facility] [address] [place], [place] [address] Phone: [phone] Fax: [phone]  06/18/2022, 3:07 PM

## 2022-07-17 LAB — TSH: TSH: 0.01 — AB (ref 0.41–5.90)

## 2022-07-30 ENCOUNTER — Telehealth: Payer: Medicare HMO | Admitting: Nurse Practitioner

## 2022-08-07 ENCOUNTER — Ambulatory Visit: Payer: Medicare HMO | Admitting: Nurse Practitioner

## 2022-08-14 ENCOUNTER — Ambulatory Visit: Payer: Medicare HMO | Admitting: Nurse Practitioner

## 2022-08-14 ENCOUNTER — Encounter: Payer: Self-pay | Admitting: Nurse Practitioner

## 2022-08-14 VITALS — BP 134/68 | HR 57 | Ht 60.25 in | Wt 154.4 lb

## 2022-08-14 DIAGNOSIS — E0590 Thyrotoxicosis, unspecified without thyrotoxic crisis or storm: Secondary | ICD-10-CM

## 2022-08-14 DIAGNOSIS — N1831 Chronic kidney disease, stage 3a: Secondary | ICD-10-CM

## 2022-08-14 DIAGNOSIS — I10 Essential (primary) hypertension: Secondary | ICD-10-CM | POA: Diagnosis not present

## 2022-08-14 DIAGNOSIS — E559 Vitamin D deficiency, unspecified: Secondary | ICD-10-CM

## 2022-08-14 DIAGNOSIS — E1122 Type 2 diabetes mellitus with diabetic chronic kidney disease: Secondary | ICD-10-CM | POA: Diagnosis not present

## 2022-08-14 NOTE — Progress Notes (Signed)
Endocrinology Follow Up Note                                         08/14/2022, 3:53 PM  Subjective:   Subjective    Brandi [name] is a 76 y.o.-year-old female patient being seen in follow up after being seen in consultation for hypothyroidism referred by Ephriam Jenkins E.  She would also like to be seen here for her diabetes management as well.   Past Medical History:  Diagnosis Date   Cancer ([place])    Diabetes mellitus ([place])    Hyperlipidemia    Hypertension    Thyroid disease     Past Surgical History:  Procedure Laterality Date   CYST EXCISION     1990    Social History   Socioeconomic History   Marital status: Married    Spouse name: Not on file   Number of children: Not on file   Years of education: Not on file   Highest education level: Not on file  Occupational History   Not on file  Tobacco Use   Smoking status: Never    Passive exposure: Past   Smokeless tobacco: Never  Vaping Use   Vaping Use: Never used  Substance and Sexual Activity   Alcohol use: Yes   Drug use: Never   Sexual activity: Not on file  Other Topics Concern   Not on file  Social History Narrative   Not on file   Social Determinants of Health   Financial Resource Strain: Not on file  Food Insecurity: Not on file  Transportation Needs: Not on file  Physical Activity: Not on file  Stress: Not on file  Social Connections: Not on file    Family History  Problem Relation Age of Onset   Hypertension Mother    Diabetes Mother    Hyperlipidemia Mother    Heart attack Mother    Hypertension Father    Stroke Father     Outpatient Encounter Medications as of 08/14/2022  Medication Sig   acetaminophen (TYLENOL) 500 MG tablet Take by mouth.   atorvastatin (LIPITOR) 20 MG tablet Take 20 mg by mouth daily.   citalopram (CELEXA) 10 MG tablet Take 10 mg by mouth daily.   losartan (COZAAR) 100 MG tablet Take  100 mg by mouth daily.   ONETOUCH VERIO test strip USE TO TEST BLOOD GLUCOSE 3 TIMES DAILY   warfarin (COUMADIN) 5 MG tablet Take by mouth. Takes 2 1/2 every day except on Wednesdays and Sundays only takes 65m   predniSONE (DELTASONE) 10 MG tablet Take 1 tablet (10 mg total) by mouth daily with breakfast. (Patient not taking: Reported on 08/14/2022)   No facility-administered encounter medications on file as of 08/14/2022.    ALLERGIES: Allergies  Allergen Reactions   Penicillins Swelling   Sufentanil Swelling   Sulfa Antibiotics Rash and Swelling   VACCINATION STATUS:  There is no immunization history on file for this patient.   HPI  Diabetes She presents  for her follow-up diabetic visit. She has type 2 diabetes mellitus. Onset time: Diagnosed at approx age of 45. Her disease course has been stable. There are no hypoglycemic associated symptoms. Pertinent negatives for hypoglycemia include no tremors. Associated symptoms include fatigue and weight loss. There are no hypoglycemic complications. Symptoms are stable. Diabetic complications include nephropathy. Risk factors for coronary artery disease include diabetes mellitus, dyslipidemia, family history, hypertension, post-menopausal, sedentary lifestyle and stress. Current diabetic treatment includes oral agent (monotherapy). She is compliant with treatment most of the time. Her weight is decreasing steadily. She is following a generally healthy diet. When asked about meal planning, she reported none. She has not had a previous visit with a dietitian. She participates in exercise intermittently. Her home blood glucose trend is fluctuating minimally. Her breakfast blood glucose range is generally 130-140 mg/dl. (She presents today with no meter or logs.  Her POCT A1c today is 7.1%.  She denies any s/s of hypoglycemia.  She continues to eat healthy but has splurged over the summer.) An ACE inhibitor/angiotensin II receptor blocker is being  taken. She does not see a podiatrist.Eye exam is current.  Thyroid Problem Presents for follow-up visit. Symptoms include fatigue, palpitations and weight loss. Patient reports no constipation, depressed mood, hair loss, leg swelling, tremors or weight gain. The symptoms have been improving.    Brandi [name]  is a patient with the above medical history. she was diagnosed with hypothyroidism at approximate age of 20 years, which required subsequent initiation of thyroid hormone replacement. she was given various doses of Levothyroxine over the years, currently on 25 (lowered between visits due to lab results) micrograms. She was off this medication last visit due to signs of over-replacement.  I reviewed patient's thyroid tests:  Lab Results  Component Value Date   TSH 0.01 (A) 07/17/2022   TSH 0.01 (A) 06/16/2022   TSH <0.005 (L) 04/17/2022   TSH <0.005 (L) 12/16/2021   TSH 0.01 (A) 09/11/2021   FREET4 3.22 (H) 04/17/2022   FREET4 2.91 (H) 12/16/2021    Pt denies feeling nodules in neck, hoarseness, dysphagia/odynophagia, SOB with lying down.  she does have family history of thyroid disorders in her sister (hypothyroidism).  No family history of thyroid cancer.  No history of radiation therapy to head or neck.  No recent use of iodine supplements.  Denies use of Biotin containing supplements.  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   Review of systems  Constitutional: + steadily decreasing body weight,  current Body mass index is 29.9 kg/m. , + fatigue, + subjective hyperthermia, no subjective hypothermia Eyes: no blurry vision, no xerophthalmia ENT: no sore throat, no nodules palpated in throat, no dysphagia/odynophagia, no hoarseness Cardiovascular: no chest pain, no shortness of breath, intermittent palpitations, no leg swelling Respiratory: no  cough, no shortness of breath Gastrointestinal: no nausea/vomiting, + diarrhea Musculoskeletal: no muscle/joint aches Skin: no rashes, no hyperemia Neurological: no tremors, no numbness, no tingling, no dizziness Psychiatric: no depression, + anxiety   Objective:   Objective     BP 134/68 (BP Location: Left Arm, Patient Position: Sitting, Cuff Size: Large)   Pulse (!) 57   Ht 5' 0.25" (1.53 m)   Wt 154 lb 6.4 oz (70 kg)   BMI 29.90 kg/m  Wt Readings from Last 3 Encounters:  08/14/22 154 lb 6.4 oz (  70 kg)  06/18/22 157 lb (71.2 kg)  04/23/22 155 lb (70.3 kg)    BP Readings from Last 3 Encounters:  08/14/22 134/68  06/18/22 133/68  04/23/22 140/62      Physical Exam- Limited  Constitutional:  Body mass index is 29.9 kg/m. , not in acute distress, normal state of mind Eyes:  EOMI, no exophthalmos Neck: Supple Cardiovascular: RRR, no murmurs, rubs, or gallops, no edema Respiratory: Adequate breathing efforts, no crackles, rales, rhonchi, or wheezing Musculoskeletal: no gross deformities, strength intact in all four extremities, no gross restriction of joint movements Skin:  no rashes, no hyperemia Neurological: no tremor with outstretched hands   CMP ( most recent) CMP     Component Value Date/Time   NA 139 12/02/2021 0000   K 4.3 12/02/2021 0000   CL 106 12/02/2021 0000   CO2 25 (A) 12/02/2021 0000   BUN 22 (A) 12/02/2021 0000   CREATININE 10.7 (A) 12/02/2021 0000   CREATININE 0.89 02/16/2009 0905   CALCIUM 9.4 12/02/2021 0000   GFRNONAA >60 02/16/2009 0905   GFRAA  02/16/2009 0905    >60        The eGFR has been calculated using the MDRD equation. This calculation has not been validated in all clinical situations. eGFR's persistently <60 mL/min signify possible Chronic Kidney Disease.     Diabetic Labs (most recent): Lab Results  Component Value Date   HGBA1C 7.1 06/18/2022   HGBA1C 6.5 03/26/2022   HGBA1C 6.7 12/02/2021   MICROALBUR 30  01/21/2022     Lipid Panel ( most recent) Lipid Panel     Component Value Date/Time   CHOL 131 12/02/2021 0000   TRIG 186 (A) 12/02/2021 0000   HDL 37 12/02/2021 0000   LDLCALC 57 12/02/2021 0000       Lab Results  Component Value Date   TSH 0.01 (A) 07/17/2022   TSH 0.01 (A) 06/16/2022   TSH <0.005 (L) 04/17/2022   TSH <0.005 (L) 12/16/2021   TSH 0.01 (A) 09/11/2021   FREET4 3.22 (H) 04/17/2022   FREET4 2.91 (H) 12/16/2021     Latest Reference Range & Units 12/16/21 14:24 04/17/22 14:59 06/16/22 00:00  TSH 0.41 - 5.90  <0.005 (L) <0.005 (L) 0.01 ! (E)  T4,Free(Direct) 0.82 - 1.77 ng/dL 2.91 (H) 3.22 (H)   Thyroperoxidase Ab SerPl-aCnc 0 - 34 IU/mL 323 (H)    Thyroglobulin Antibody 0.0 - 0.9 IU/mL 7.5 (H)    (L): Data is abnormally low !: Data is abnormal (H): Data is abnormally high (E): External lab result  Assessment & Plan:   ASSESSMENT / PLAN:  1) Hypothyroidism-r/t Hashimoto's thyroiditis Her antibody testing was positive, indicating she has autoimmune thyroid dysfunction.  Her repeat thyroid function tests are still consistent with hyperthyroidism but she misunderstood the directions at last visit to stop the thyroid hormone replacement, thus she has continued to take it since.  I advised her to stop the medication and we will repeat thyroid function tests in 4 weeks to assess response.  We did discuss the possibility of needing uptake and scan if labs do not correct after stopping her thyroid hormone.  -Due to absence of clinical goiter, no need for thyroid ultrasound.   2) Type 2 Diabetes  She presents today with no meter or logs.  Her POCT A1c today is 7.1%.  She denies any s/s of hypoglycemia.  She continues to eat healthy but has splurged over the summer.  - Emmalynn has currently uncontrolled  symptomatic type 2 DM since 76 years of age.  -Recent labs reviewed.  - I had a long discussion with her about the progressive nature of diabetes and the  pathology behind its complications. 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 These are all discussed in detail with her.  The following Lifestyle Medicine recommendations according to [facility]) were discussed and offered to patient and she agrees to start the journey:  A. Whole Foods, Plant-based plate comprising of fruits and vegetables, plant-based proteins, whole-grain carbohydrates was discussed in detail with the patient.   A list for source of those nutrients were also provided to the patient.  Patient will use only water or unsweetened tea for hydration. B.  The need to stay away from risky substances including alcohol, smoking; obtaining 7 to 9 hours of restorative sleep, at least 150 minutes of moderate intensity exercise weekly, the importance of healthy social connections,  and stress reduction techniques were discussed. C.  A full color page of  Calorie density of various food groups per pound showing examples of each food groups was provided to the patient.  - Nutritional counseling repeated at each appointment due to patients tendency to fall back in to old habits.  - The patient admits there is a room for improvement in their diet and drink choices. -  Suggestion is made for the patient to avoid simple carbohydrates from their diet including Cakes, Sweet Desserts / Pastries, Ice Cream, Soda (diet and regular), Sweet Tea, Candies, Chips, Cookies, Sweet Pastries, Store Bought Juices, Alcohol in Excess of 1-2 drinks a day, Artificial Sweeteners, Coffee Creamer, and "Sugar-free" Products. This will help patient to have stable blood glucose profile and potentially avoid unintended weight gain.   - I encouraged the patient to switch to unprocessed or minimally processed complex starch and increased protein intake (animal or plant  source), fruits, and vegetables.   - Patient is advised to stick to a routine mealtimes to eat 3 meals a day and avoid unnecessary snacks (to snack only to correct hypoglycemia).  - I have approached her with the following individualized plan to manage her diabetes and patient agrees:   -Given her improved glycemic profile and healthier lifestyle, her Metformin will be discontinued and she will be given a chance to continue progress without it.  She prefers to continue monitoring her glucose at least once daily for awareness.  Will recheck A1c at next visit.  - Adjustment parameters are given to her for hypo and hyperglycemia in writing.  - Specific targets for  A1c; LDL, HDL, and Triglycerides were discussed with the patient.  3) Blood Pressure /Hypertension: Her blood pressure is controlled to target.   She is advised to continue current medications including Losartan 100 mg p.o. daily with breakfast.    4) Lipids/Hyperlipidemia:    Review of her recent lipid panel from 12/02/21 showed controlled LDL at 57.  She is advised to continue Lipitor 20 mg daily at bedtime.  Side effects and precautions discussed with her.  5)  Weight/Diet:  Her Body mass index is 29.9 kg/m.  - She is Not a candidate for weight loss.  Exercise, and detailed carbohydrates information provided  -  detailed on discharge instructions.  6) Chronic Care/Health Maintenance: -She is on ACEI/ARB and Statin medications and is encouraged to initiate and continue to follow up  with Ophthalmology, Dentist, Podiatrist at least yearly or according to recommendations, and advised to stay away from smoking. I have recommended yearly flu vaccine and pneumonia vaccine at least every 5 years; moderate intensity exercise for up to 150 minutes weekly; and sleep for at least 7 hours a day.  - She is advised to maintain close follow up with Ephriam Jenkins for primary care needs, as well as her other providers for optimal and coordinated  care.     I spent 31 minutes in the care of the patient today including review of labs from [place], Lipids, Thyroid Function, Hematology (current and previous including abstractions from other facilities); face-to-face time discussing  her blood glucose readings/logs, discussing hypoglycemia and hyperglycemia episodes and symptoms, medications doses, her options of short and long term treatment based on the latest standards of care / guidelines;  discussion about incorporating lifestyle medicine;  and documenting the encounter. Risk reduction counseling performed per USPSTF guidelines to reduce obesity and cardiovascular risk factors.     Please refer to Patient Instructions for Blood Glucose Monitoring and Insulin/Medications Dosing Guide"  in media tab for additional information. Please  also refer to " Patient Self Inventory" in the Media  tab for reviewed elements of pertinent patient history.  Brandi [name] participated in the discussions, expressed understanding, and voiced agreement with the above plans.  All questions were answered to her satisfaction. she is encouraged to contact clinic should she have any questions or concerns prior to her return visit.   FOLLOW UP PLAN:  Return in about 4 weeks (around 09/11/2022) for Thyroid follow up, Previsit labs.  Rayetta Pigg, [facility] [address] [place], [place] [address] Phone: [phone] Fax: [phone]  08/14/2022, 3:53 PM

## 2022-08-21 ENCOUNTER — Other Ambulatory Visit: Payer: Self-pay | Admitting: Nurse Practitioner

## 2022-09-04 LAB — TSH
TSH: 0.01 — AB (ref 0.41–5.90)
TSH: 0.01 — AB (ref 0.41–5.90)

## 2022-09-11 ENCOUNTER — Encounter: Payer: Self-pay | Admitting: Nurse Practitioner

## 2022-09-11 ENCOUNTER — Ambulatory Visit: Payer: Medicare HMO | Admitting: Nurse Practitioner

## 2022-09-11 VITALS — BP 110/60 | HR 60 | Ht 60.25 in | Wt 153.2 lb

## 2022-09-11 DIAGNOSIS — E0590 Thyrotoxicosis, unspecified without thyrotoxic crisis or storm: Secondary | ICD-10-CM | POA: Diagnosis not present

## 2022-09-11 NOTE — Patient Instructions (Signed)
Hyperthyroidism Hyperthyroidism refers to a thyroid gland that is too active or overactive. The thyroid gland is a small gland located in the lower front part of the neck, just in front of the windpipe (trachea). This gland makes hormones that: Help control how the body uses food for energy (metabolism). Help the heart and brain work well. Keep your bones strong. When the thyroid is overactive, it produces too much of a hormone called thyroxine. What are the causes? This condition may be caused by: Graves' disease. This is a disorder in which the body's disease-fighting system (immune system) attacks the thyroid gland. This is the most common cause. Inflammation of the thyroid gland. A tumor in the thyroid gland. Use of certain medicines, including: Prescription thyroid hormone replacement. Herbal supplements that mimic thyroid hormones. Amiodarone therapy. Solid or fluid-filled lumps within your thyroid gland (thyroid nodules). Taking in a large amount of iodine from foods or medicines. What increases the risk? You are more likely to develop this condition if: You are female. You have a family history of thyroid conditions. You smoke tobacco. You use a medicine called lithium. You take medicines that affect the immune system (immunosuppressants). What are the signs or symptoms? Symptoms of this condition include: Nervousness. Inability to tolerate heat. Diarrhea. Rapid heart rate. Shaky hands. Restlessness. Sleep problems. Other symptoms may include: Heart skipping beats or making extra beats. Unexplained weight loss. Change in the texture of hair or skin. Loss of menstruation. Fatigue. Enlarged thyroid gland or a lump in the thyroid (nodule). You may also have symptoms of Graves' disease, which may include: Protruding eyes. Dry eyes. Red or swollen eyes. Problems with vision. How is this diagnosed? This condition may be diagnosed based on: Your symptoms and medical  history. A physical exam. Blood tests. Thyroid ultrasound. This test involves using sound waves to produce images of the thyroid gland. A thyroid scan. A radioactive substance is injected into a vein, and images show how much iodine is present in the thyroid. Radioactive iodine uptake test (RAIU). A small amount of radioactive iodine is given by mouth to see how much iodine the thyroid absorbs after a certain amount of time. How is this treated? Treatment depends on the cause and severity of the condition. Treatment may include: Medicines to reduce the amount of thyroid hormone your body makes. Medicines to help manage your symptoms. Radioactive iodine treatment (radioiodine therapy). This involves swallowing a small dose of radioactive iodine, in capsule or liquid form, to kill thyroid cells. Surgery to remove part or all of your thyroid gland. You may need to take thyroid hormone replacement medicine for the rest of your life after thyroid surgery. Follow these instructions at home:  Take over-the-counter and prescription medicines only as told by your health care provider. Do not use any products that contain nicotine or tobacco. These products include cigarettes, chewing tobacco, and vaping devices, such as e-cigarettes. If you need help quitting, ask your health care provider. Follow any instructions from your health care provider about diet. You may be instructed to limit foods that contain iodine. Keep all follow-up visits. You will need to have blood tests regularly so that your health care provider can monitor your condition. Where to find more information National Institute of Diabetes and Digestive and Kidney Diseases: niddk.nih.gov Contact a health care provider if: Your symptoms do not get better with treatment. You have a fever. You have abdominal pain. You feel dizzy. You are taking thyroid hormone replacement medicine and: You have   symptoms of depression. You feel like you  are tired all the time. You gain weight. Get help right away if: You have sudden, unexplained confusion or other mental changes. You have chest pain. You have fast or irregular heartbeats (palpitations). You have difficulty breathing. These symptoms may be an emergency. Get help right away. Call 911. Do not wait to see if the symptoms will go away. Do not drive yourself to the hospital. Summary The thyroid gland is a small gland located in the lower front part of the neck, just in front of the windpipe. Hyperthyroidism is when the thyroid gland is too active and produces too much of a hormone called thyroxine. The most common cause is Graves' disease, a disorder in which your immune system attacks the thyroid gland. Hyperthyroidism can cause various symptoms, such as unexplained weight loss, nervousness, inability to tolerate heat, or changes in your heartbeat. Treatment may include medicine to reduce the amount of thyroid hormone your body makes, radioiodine therapy, surgery, or medicines to manage symptoms. This information is not intended to replace advice given to you by your health care provider. Make sure you discuss any questions you have with your health care provider. Document Revised: 12/13/2021 Document Reviewed: 12/13/2021 Elsevier Patient Education  2023 [employer]  

## 2022-09-11 NOTE — Progress Notes (Signed)
Endocrinology Follow Up Note                                         09/11/2022, 4:05 PM  Subjective:   Subjective    Brandi [name] is a 76 y.o.-year-old female patient being seen in follow up after being seen in consultation for hypothyroidism referred by Ephriam Jenkins E.  She would also like to be seen here for her diabetes management as well.   Past Medical History:  Diagnosis Date   Cancer ([place])    Diabetes mellitus ([place])    Hyperlipidemia    Hypertension    Thyroid disease     Past Surgical History:  Procedure Laterality Date   CYST EXCISION     1990    Social History   Socioeconomic History   Marital status: Married    Spouse name: Not on file   Number of children: Not on file   Years of education: Not on file   Highest education level: Not on file  Occupational History   Not on file  Tobacco Use   Smoking status: Never    Passive exposure: Past   Smokeless tobacco: Never  Vaping Use   Vaping Use: Never used  Substance and Sexual Activity   Alcohol use: Yes   Drug use: Never   Sexual activity: Not on file  Other Topics Concern   Not on file  Social History Narrative   Not on file   Social Determinants of Health   Financial Resource Strain: Not on file  Food Insecurity: Not on file  Transportation Needs: Not on file  Physical Activity: Not on file  Stress: Not on file  Social Connections: Not on file    Family History  Problem Relation Age of Onset   Hypertension Mother    Diabetes Mother    Hyperlipidemia Mother    Heart attack Mother    Hypertension Father    Stroke Father     Outpatient Encounter Medications as of 09/11/2022  Medication Sig   acetaminophen (TYLENOL) 500 MG tablet Take by mouth.   atorvastatin (LIPITOR) 20 MG tablet Take 20 mg by mouth daily.   citalopram (CELEXA) 10 MG tablet Take 10 mg by mouth daily.   losartan (COZAAR) 100 MG tablet Take  100 mg by mouth daily.   ONETOUCH VERIO test strip USE TO TEST BLOOD GLUCOSE 3 TIMES DAILY   warfarin (COUMADIN) 5 MG tablet Take by mouth. Takes 2 1/2 every day except on Wednesdays and Sundays only takes 66m   predniSONE (DELTASONE) 10 MG tablet Take 1 tablet (10 mg total) by mouth daily with breakfast. (Patient not taking: Reported on 08/14/2022)   No facility-administered encounter medications on file as of 09/11/2022.    ALLERGIES: Allergies  Allergen Reactions   Penicillins Swelling   Sufentanil Swelling   Sulfa Antibiotics Rash and Swelling   VACCINATION STATUS:  There is no immunization history on file for this patient.   HPI  Thyroid Problem Presents  for follow-up visit. Symptoms include anxiety, fatigue, palpitations and tremors. Patient reports no constipation, depressed mood, hair loss, leg swelling or weight gain. The symptoms have been stable.    Brandi [name]  is a patient with the above medical history. she was diagnosed with hypothyroidism at approximate age of 39 years, which required subsequent initiation of thyroid hormone replacement. she was given various doses of Levothyroxine over the years, currently on 25 (lowered between visits due to lab results) micrograms. She was taken off this medication last visit due to signs of over-replacement.  I reviewed patient's thyroid tests:  Lab Results  Component Value Date   TSH 0.01 (A) 09/04/2022   TSH 0.01 (A) 09/04/2022   TSH 0.01 (A) 07/17/2022   TSH 0.01 (A) 06/16/2022   TSH <0.005 (L) 04/17/2022   TSH <0.005 (L) 12/16/2021   TSH 0.01 (A) 09/11/2021   FREET4 3.22 (H) 04/17/2022   FREET4 2.91 (H) 12/16/2021    Pt denies feeling nodules in neck, hoarseness, dysphagia/odynophagia, SOB with lying down.  she does have family history of thyroid disorders in her sister (hypothyroidism).  No family history of thyroid cancer.  No history of radiation therapy to head or neck.  No recent use of iodine  supplements.  Denies use of Biotin containing supplements.  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   Review of systems  Constitutional: + steadily decreasing body weight,  current Body mass index is 29.67 kg/m. , + fatigue, + subjective hyperthermia, no subjective hypothermia Eyes: no blurry vision, no xerophthalmia ENT: no sore throat, no nodules palpated in throat, no dysphagia/odynophagia, no hoarseness Cardiovascular: no chest pain, no shortness of breath, intermittent palpitations, no leg swelling Respiratory: no cough, no shortness of breath Gastrointestinal: no nausea/vomiting, + diarrhea Musculoskeletal: no muscle/joint aches Skin: no rashes, no hyperemia Neurological: no tremors, no numbness, no tingling, no dizziness Psychiatric: no depression, + anxiety   Objective:   Objective     BP 110/60 (BP Location: Left Arm, Patient Position: Sitting, Cuff Size: Large)   Pulse 60   Ht 5' 0.25" (1.53 m)   Wt 153 lb 3.2 oz (69.5 kg)   BMI 29.67 kg/m  Wt Readings from Last 3 Encounters:  09/11/22 153 lb 3.2 oz (69.5 kg)  08/14/22 154 lb 6.4 oz (70 kg)  06/18/22 157 lb (71.2 kg)    BP Readings from Last 3 Encounters:  09/11/22 110/60  08/14/22 134/68  06/18/22 133/68      Physical Exam- Limited  Constitutional:  Body mass index is 29.67 kg/m. , not in acute distress, normal state of mind Eyes:  EOMI, no exophthalmos Neck: Supple Cardiovascular: RRR, + murmur, rubs, or gallops, no edema Respiratory: Adequate breathing efforts, no crackles, rales, rhonchi, or wheezing Musculoskeletal: no gross deformities, strength intact in all four extremities, no gross restriction of joint movements Skin:  no rashes, no hyperemia Neurological: + slight tremor with outstretched hands, DTR slightly hyperactive bilaterally   CMP ( most recent) CMP      Component Value Date/Time   NA 139 12/02/2021 0000   K 4.3 12/02/2021 0000   CL 106 12/02/2021 0000   CO2 25 (A) 12/02/2021 0000   BUN 22 (A) 12/02/2021 0000   CREATININE 10.7 (A) 12/02/2021 0000   CREATININE 0.89 02/16/2009 0905   CALCIUM 9.4 12/02/2021 0000   GFRNONAA >60  02/16/2009 0905   GFRAA  02/16/2009 0905    >60        The eGFR has been calculated using the MDRD equation. This calculation has not been validated in all clinical situations. eGFR's persistently <60 mL/min signify possible Chronic Kidney Disease.     Diabetic Labs (most recent): Lab Results  Component Value Date   HGBA1C 7.1 06/18/2022   HGBA1C 6.5 03/26/2022   HGBA1C 6.7 12/02/2021   MICROALBUR 30 01/21/2022     Lipid Panel ( most recent) Lipid Panel     Component Value Date/Time   CHOL 131 12/02/2021 0000   TRIG 186 (A) 12/02/2021 0000   HDL 37 12/02/2021 0000   LDLCALC 57 12/02/2021 0000       Lab Results  Component Value Date   TSH 0.01 (A) 09/04/2022   TSH 0.01 (A) 09/04/2022   TSH 0.01 (A) 07/17/2022   TSH 0.01 (A) 06/16/2022   TSH <0.005 (L) 04/17/2022   TSH <0.005 (L) 12/16/2021   TSH 0.01 (A) 09/11/2021   FREET4 3.22 (H) 04/17/2022   FREET4 2.91 (H) 12/16/2021     Latest Reference Range & Units 04/17/22 14:59 06/16/22 00:00 07/17/22 00:00 09/04/22 00:00  TSH 0.41 - 5.90  0.41 - 5.90  <0.005 (L) 0.01 ! (E) 0.01 ! (E) 0.01 ! (E) 0.01 ! (E)  T4,Free(Direct) 0.82 - 1.77 ng/dL 3.22 (H)     (L): Data is abnormally low !: Data is abnormal (H): Data is abnormally high (E): External lab result  Assessment & Plan:   ASSESSMENT / PLAN:  1) Hyperthyroidism  Her previsit thyroid function tests are consistent with over active thyroid gland despite stopping her thyroid hormone supplement which she was on for Hashimoto's thyroiditis.  She does have positive thyroid antibodies indicating she is genetically predisposed to have problems with her thyroid.  I discussed the risks of  untreated hyperthyroidism and ordered uptake and scan to be done as soon as possible which will help to determine to what degree the thyroid is overactive and also look for nodules that may be overproducing hormone.  She agrees to proceed.  We discussed the preferred treatment for hyperthyroidism is RAI ablation.  There are medications such as Methimazole which can help slow the thyroid hormone production but it can have negative side effects such as bone marrow suppression and liver damage.  The likelihood of recurrence on such medications is high as well.  She is most concerned that stopping her thyroid will cause her to gain weight uncontrollably.  I did reassure her that thyroid hormone can be replaced relatively easy to help stabilize her weight.     I spent 35 minutes in the care of the patient today including review of labs from Thyroid Function, CMP, and other relevant labs ; imaging/biopsy records (current and previous including abstractions from other facilities); face-to-face time discussing  her lab results and symptoms, medications doses, her options of short and long term treatment based on the latest standards of care / guidelines;   and documenting the encounter.  Brandi [name]  participated in the discussions, expressed understanding, and voiced agreement with the above plans.  All questions were answered to her satisfaction. she is encouraged to contact clinic should she have any questions or concerns prior to her return visit.   FOLLOW UP PLAN:  Return in about 4 weeks (around 10/09/2022) for Thyroid follow up, uptake and scan.  Rayetta Pigg, FNP-BC [facility] [address] [place],  [place] [address] Phone: [phone] Fax: [phone]  09/11/2022, 4:05 PM

## 2022-10-06 ENCOUNTER — Telehealth: Payer: Self-pay | Admitting: *Deleted

## 2022-10-06 ENCOUNTER — Other Ambulatory Visit: Payer: Self-pay | Admitting: Nurse Practitioner

## 2022-10-06 MED ORDER — PROPRANOLOL HCL 20 MG PO TABS: 20.0000 mg | ORAL_TABLET | Freq: Two times a day (BID) | ORAL | 2 refills | Status: DC

## 2022-10-06 NOTE — Telephone Encounter (Signed)
Patient called and left the following message. She is to have the procedure tomorrow as ordered by [employer]. She states that her heart rate has been high , up to 135 bpm. She ask if Whitney needed to see her this afternoon after 3 pm as she works.  I addressed with Loree Fee, she has called in Propranolol 20 mg patient wil take twice a day, until Whitney calls her with the results from the test tomorrow. The medication was called to [facility].  I have tried numerous times to reach her by the numbers on her record. Both just ring and ring, no voicemail came on.

## 2022-10-07 ENCOUNTER — Ambulatory Visit (HOSPITAL_COMMUNITY)
Admission: RE | Admit: 2022-10-07 | Discharge: 2022-10-07 | Disposition: A | Discharge status: Home / Self Care | Payer: Medicare HMO | Source: Ambulatory Visit | Attending: Nurse Practitioner | Admitting: Nurse Practitioner

## 2022-10-07 ENCOUNTER — Encounter (HOSPITAL_COMMUNITY): Payer: Self-pay

## 2022-10-07 DIAGNOSIS — E0590 Thyrotoxicosis, unspecified without thyrotoxic crisis or storm: Secondary | ICD-10-CM | POA: Insufficient documentation

## 2022-10-07 MED ORDER — SODIUM IODIDE I-123 7.4 MBQ CAPS
500.0000 | ORAL_CAPSULE | Freq: Once | ORAL | Status: AC
  Administered 2022-10-07: 427 via ORAL

## 2022-10-08 ENCOUNTER — Ambulatory Visit (HOSPITAL_COMMUNITY)
Admission: RE | Admit: 2022-10-08 | Discharge: 2022-10-08 | Disposition: A | Discharge status: Home / Self Care | Payer: Medicare HMO | Source: Ambulatory Visit | Attending: Nurse Practitioner | Admitting: Nurse Practitioner

## 2022-10-14 ENCOUNTER — Ambulatory Visit: Payer: Medicare HMO | Admitting: Nurse Practitioner

## 2022-10-14 ENCOUNTER — Encounter: Payer: Self-pay | Admitting: Nurse Practitioner

## 2022-10-14 VITALS — BP 118/77 | HR 88 | Ht 60.25 in | Wt 156.0 lb

## 2022-10-14 DIAGNOSIS — E0590 Thyrotoxicosis, unspecified without thyrotoxic crisis or storm: Secondary | ICD-10-CM

## 2022-10-14 NOTE — Patient Instructions (Signed)
Radioiodine (I-131) Therapy for Hyperthyroidism  Radioiodine (I-131) therapy is a treatment for an overactive thyroid gland (hyperthyroidism). The thyroid is a gland in the neck that makes hormones that affect how the body processes food for energy (metabolism) and how the heart and brain function. This treatment involves swallowing a pill or liquid that contains I-131. I-131 is manufactured (synthetic) iodine that gives off radiation. After it is swallowed, the I-131 will be absorbed by the thyroid gland over the next few months. It will destroy thyroid cells and reverse hyperthyroidism. Tell a health care provider about: Any allergies you have. All medicines you are taking, including vitamins, herbs, eye drops, creams, and over-the-counter medicines. Any bleeding problems you have. Any surgeries you have had. Any medical conditions you have. Whether you are pregnant, may be pregnant, or have gone through menopause. The health care provider will also want to know: Whether you are breastfeeding. Whether you plan to have children in the next 2 years. Any contact you have with children or pregnant women. Your travel plans for the next 3 months. Whether you pass through radiation detectors for work or travel. What are the risks? Generally, this is a safe procedure. However, problems may occur, including: Damage to other structures or organs, such as the salivary glands. This could lead to dry mouth and loss of taste. Low sperm count. This may lead to temporary infertility. Sore throat or neck pain. This is temporary. Slightly increased risk of thyroid cancer. Nausea or vomiting. Hypothyroidism. This is a condition in which the thyroid gland does not make enough thyroid hormone. Radiation thyroiditis. This is the swelling of the thyroid gland that is caused by the radioiodine therapy. What happens before the procedure? When to stop eating and drinking Follow instructions from your health care  provider about what you may eat and drink. These may include: 2 hours before your procedure Stop eating all foods. Stop drinking all liquids. You may be allowed to take medicines with small sips of water. If you do not follow your health care provider's instructions, your procedure may be delayed or canceled. Medicines Ask your health care provider about: Changing or stopping your regular medicines. These include any diabetes medicines, blood thinners, or thyroid medicines. Taking over-the-counter medicines, vitamins, herbs, and supplements. General instructions Women may be asked to take a pregnancy test. Women who are breastfeeding should: Plan to stop at least 6 weeks before the procedure. Not go back to breastfeeding after the procedure until their health care provider approves. Plan to avoid contact with other people for 1 week after your treatment to protect others from exposure to radiation as it leaves your body. Avoiding contact with children and pregnant women is especially important. To do this, plan to stay home from work, arrange child care, and sleep alone. Plan to drive yourself home after treatment. Do not take public transportation. If you need someone to drive you home, sit as far away from the driver as possible. What happens during the procedure? You will be given a dose of I-131 to swallow. It may be a capsule or a liquid. Your thyroid gland will absorb the I-131 over the next 3 months. The treatment process will be complete in about 6 months. What happens after the procedure? You may need to stay in the hospital for 24 hours after your treatment. This depends on the requirements in your state. Follow instructions from your health care provider about: How to take care of yourself after the procedure. How to  protect others from exposure to radiation as it leaves your body. Summary Radioiodine (I-131) therapy is a treatment for an overactive thyroid gland  (hyperthyroidism). This treatment involves swallowing a capsule or liquid that contains I-131. I-131 is manufactured iodine that gives off radiation. Your thyroid gland will absorb the I-131 over the next 3 months. The I-131 destroys thyroid cells and reverses hyperthyroidism. Follow instructions from your health care provider about how to take care of yourself and how to protect other people from exposure to radiation after the procedure. This information is not intended to replace advice given to you by your health care provider. Make sure you discuss any questions you have with your health care provider. Document Revised: 12/13/2021 Document Reviewed: 12/13/2021 Elsevier Patient Education  [employer].

## 2022-10-14 NOTE — Progress Notes (Signed)
Endocrinology Follow Up Note                                         10/14/2022, 2:09 PM  Subjective:   Subjective    Brandi [name] is a 76 y.o.-year-old female patient being seen in follow up after being seen in consultation for hypothyroidism referred by Ephriam Jenkins E.  She would also like to be seen here for her diabetes management as well.   Past Medical History:  Diagnosis Date   Cancer ([place])    Diabetes mellitus ([place])    Hyperlipidemia    Hypertension    Thyroid disease     Past Surgical History:  Procedure Laterality Date   CYST EXCISION     1990    Social History   Socioeconomic History   Marital status: Married    Spouse name: Not on file   Number of children: Not on file   Years of education: Not on file   Highest education level: Not on file  Occupational History   Not on file  Tobacco Use   Smoking status: Never    Passive exposure: Past   Smokeless tobacco: Never  Vaping Use   Vaping Use: Never used  Substance and Sexual Activity   Alcohol use: Yes   Drug use: Never   Sexual activity: Not on file  Other Topics Concern   Not on file  Social History Narrative   Not on file   Social Determinants of Health   Financial Resource Strain: Not on file  Food Insecurity: Not on file  Transportation Needs: Not on file  Physical Activity: Not on file  Stress: Not on file  Social Connections: Not on file    Family History  Problem Relation Age of Onset   Hypertension Mother    Diabetes Mother    Hyperlipidemia Mother    Heart attack Mother    Hypertension Father    Stroke Father     Outpatient Encounter Medications as of 10/14/2022  Medication Sig   acetaminophen (TYLENOL) 500 MG tablet Take by mouth.   atorvastatin (LIPITOR) 20 MG tablet Take 20 mg by mouth daily.   citalopram (CELEXA) 10 MG tablet Take 10 mg by mouth daily.   ONETOUCH VERIO test strip USE TO TEST  BLOOD GLUCOSE 3 TIMES DAILY   propranolol (INDERAL) 20 MG tablet Take 1 tablet (20 mg total) by mouth 2 (two) times daily.   warfarin (COUMADIN) 5 MG tablet Take by mouth. Takes 2 1/2 every day except on Wednesdays and Sundays only takes 32m   losartan (COZAAR) 100 MG tablet Take 100 mg by mouth daily. (Patient not taking: Reported on 10/14/2022)   predniSONE (DELTASONE) 10 MG tablet Take 1 tablet (10 mg total) by mouth daily with breakfast. (Patient not taking: Reported on 08/14/2022)   No facility-administered encounter medications on file as of 10/14/2022.    ALLERGIES: Allergies  Allergen Reactions   Penicillins Swelling   Sufentanil Swelling   Sulfa Antibiotics  Rash and Swelling   VACCINATION STATUS:  There is no immunization history on file for this patient.   HPI  Thyroid Problem Presents for follow-up visit. Symptoms include anxiety, fatigue, palpitations (improved with Propanolol) and tremors. Patient reports no constipation, depressed mood, hair loss, leg swelling or weight gain. The symptoms have been stable.    Brandi [name]  is a patient with the above medical history. she was diagnosed with hypothyroidism at approximate age of 68 years, which required subsequent initiation of thyroid hormone replacement. she was given various doses of Levothyroxine over the years, currently on 25 (lowered between visits due to lab results) micrograms. She was taken off this medication last visit due to signs of over-replacement.  I reviewed patient's thyroid tests:  Lab Results  Component Value Date   TSH 0.01 (A) 09/04/2022   TSH 0.01 (A) 09/04/2022   TSH 0.01 (A) 07/17/2022   TSH 0.01 (A) 06/16/2022   TSH <0.005 (L) 04/17/2022   TSH <0.005 (L) 12/16/2021   TSH 0.01 (A) 09/11/2021   FREET4 3.22 (H) 04/17/2022   FREET4 2.91 (H) 12/16/2021    Pt denies feeling nodules in neck, hoarseness, dysphagia/odynophagia, SOB with lying down.  she does have family history of  thyroid disorders in her sister (hypothyroidism).  No family history of thyroid cancer.  No history of radiation therapy to head or neck.  No recent use of iodine supplements.  Denies use of Biotin containing supplements.  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   Review of systems  Constitutional: + stable body weight,  current Body mass index is 30.21 kg/m. , + fatigue, + subjective hyperthermia, no subjective hypothermia Eyes: no blurry vision, no xerophthalmia ENT: no sore throat, no nodules palpated in throat, no dysphagia/odynophagia, no hoarseness Cardiovascular: no chest pain, no shortness of breath, intermittent palpitations, no leg swelling Respiratory: no cough, no shortness of breath Gastrointestinal: no nausea/vomiting, + diarrhea Musculoskeletal: no muscle/joint aches Skin: no rashes, no hyperemia Neurological: no tremors, no numbness, no tingling, no dizziness Psychiatric: no depression, + anxiety   Objective:   Objective     Ht 5' 0.25" (1.53 m)   Wt 156 lb (70.8 kg)   BMI 30.21 kg/m  Wt Readings from Last 3 Encounters:  10/14/22 156 lb (70.8 kg)  09/11/22 153 lb 3.2 oz (69.5 kg)  08/14/22 154 lb 6.4 oz (70 kg)    BP Readings from Last 3 Encounters:  09/11/22 110/60  08/14/22 134/68  06/18/22 133/68      Physical Exam- Limited  Constitutional:  Body mass index is 30.21 kg/m. , not in acute distress, normal state of mind Eyes:  EOMI, no exophthalmos Musculoskeletal: no gross deformities, strength intact in all four extremities, no gross restriction of joint movements    CMP ( most recent) CMP     Component Value Date/Time   NA 139 12/02/2021 0000   K 4.3 12/02/2021 0000   CL 106 12/02/2021 0000   CO2 25 (A) 12/02/2021 0000   BUN 22 (A) 12/02/2021 0000   CREATININE 10.7 (A) 12/02/2021 0000   CREATININE 0.89 02/16/2009  0905   CALCIUM 9.4 12/02/2021 0000   GFRNONAA >60 02/16/2009 0905   GFRAA  02/16/2009 0905    >60        The eGFR has been calculated using the MDRD equation. This calculation  has not been validated in all clinical situations. eGFR's persistently <60 mL/min signify possible Chronic Kidney Disease.     Diabetic Labs (most recent): Lab Results  Component Value Date   HGBA1C 7.1 06/18/2022   HGBA1C 6.5 03/26/2022   HGBA1C 6.7 12/02/2021   MICROALBUR 30 01/21/2022     Lipid Panel ( most recent) Lipid Panel     Component Value Date/Time   CHOL 131 12/02/2021 0000   TRIG 186 (A) 12/02/2021 0000   HDL 37 12/02/2021 0000   LDLCALC 57 12/02/2021 0000       Lab Results  Component Value Date   TSH 0.01 (A) 09/04/2022   TSH 0.01 (A) 09/04/2022   TSH 0.01 (A) 07/17/2022   TSH 0.01 (A) 06/16/2022   TSH <0.005 (L) 04/17/2022   TSH <0.005 (L) 12/16/2021   TSH 0.01 (A) 09/11/2021   FREET4 3.22 (H) 04/17/2022   FREET4 2.91 (H) 12/16/2021     Latest Reference Range & Units 04/17/22 14:59 06/16/22 00:00 07/17/22 00:00 09/04/22 00:00  TSH 0.41 - 5.90  0.41 - 5.90  <0.005 (L) 0.01 ! (E) 0.01 ! (E) 0.01 ! (E) 0.01 ! (E)  T4,Free(Direct) 0.82 - 1.77 ng/dL 3.22 (H)     (L): Data is abnormally low !: Data is abnormal (H): Data is abnormally high (E): External lab result   Uptake and Scan from 10/08/22 CLINICAL DATA:  Hyperthyroidism, anxiety, weight loss, tremors, palpitations, increased tiredness, brittle nails, suppressed TSH   EXAM: THYROID SCAN AND UPTAKE - 4 AND 24 HOURS   TECHNIQUE: Following oral administration of I-123 capsule, anterior planar imaging was acquired at 24 hours. Thyroid uptake was calculated with a thyroid probe at 4-6 hours and 24 hours.   RADIOPHARMACEUTICALS:  427 uCi I-123 sodium iodide p.o.   COMPARISON:  None Available.   FINDINGS: Homogeneous tracer distribution in both thyroid lobes.   No focal areas of increased or decreased tracer  localization.   4 hour I-123 uptake = 17.8% (normal 5-20%)   24 hour I-123 uptake = 40.4% (normal 10-30%)   IMPRESSION: Homogeneous tracer uptake within both thyroid lobes with elevated 24 hour radio iodine uptake of 40.4%.   Findings consistent with Graves disease.     Electronically Signed   By: Lavonia Dana M.D.   On: 10/08/2022 13:36  Assessment & Plan:   ASSESSMENT / PLAN:  1) Hyperthyroidism  Her previsit thyroid function tests are consistent with over active thyroid gland despite stopping her thyroid hormone supplement which she was on for Hashimoto's thyroiditis.  She does have positive thyroid antibodies indicating she is genetically predisposed to have problems with her thyroid.  Her 4-hour uptake was normal at 17.8% and 24-hr uptake was high at 40.4%, consistent with Graves disease.  We discussed the preferred treatment for hyperthyroidism is RAI ablation.  There are medications such as Methimazole which can help slow the thyroid hormone production but it can have negative side effects such as bone marrow suppression and liver damage.  The likelihood of recurrence on such medications is high as well.  Given she is on Warfarin, she is not a good candidate for Methimazole or PTU as it may affect the efficacy of her blood thinner causing hemorrhage or clotting.  She is most concerned that stopping her thyroid will cause her to gain weight uncontrollably.  I did reassure her that thyroid hormone can be replaced relatively easy to help stabilize her weight.  She would like to discuss this with her family  and let me know how to proceed.    I spent 35 minutes in the care of the patient today including review of labs from Thyroid Function, CMP, and other relevant labs ; imaging/biopsy records (current and previous including abstractions from other facilities); face-to-face time discussing  her lab results and symptoms, medications doses, her options of short and long term treatment  based on the latest standards of care / guidelines;   and documenting the encounter.  Brandi [name]  participated in the discussions, expressed understanding, and voiced agreement with the above plans.  All questions were answered to her satisfaction. she is encouraged to contact clinic should she have any questions or concerns prior to her return visit.   FOLLOW UP PLAN:  No follow-ups on file.  Rayetta Pigg, [facility] [address] [place], [place] [address] Phone: [phone] Fax: [phone]  10/14/2022, 2:09 PM

## 2022-11-12 LAB — TSH: TSH: 0.01 — AB (ref 0.41–5.90)

## 2022-11-19 ENCOUNTER — Encounter: Payer: Self-pay | Admitting: Nurse Practitioner

## 2022-11-19 ENCOUNTER — Ambulatory Visit: Payer: Medicare HMO | Admitting: Nurse Practitioner

## 2022-11-19 VITALS — BP 115/74 | HR 91 | Ht 60.25 in | Wt 156.8 lb

## 2022-11-19 DIAGNOSIS — E1122 Type 2 diabetes mellitus with diabetic chronic kidney disease: Secondary | ICD-10-CM | POA: Diagnosis not present

## 2022-11-19 DIAGNOSIS — E0590 Thyrotoxicosis, unspecified without thyrotoxic crisis or storm: Secondary | ICD-10-CM | POA: Diagnosis not present

## 2022-11-19 DIAGNOSIS — N1831 Chronic kidney disease, stage 3a: Secondary | ICD-10-CM

## 2022-11-19 LAB — POCT GLYCOSYLATED HEMOGLOBIN (HGB A1C): Hemoglobin A1C: 8.9 % — AB (ref 4.0–5.6)

## 2022-11-19 NOTE — Progress Notes (Signed)
Endocrinology Follow Up Note                                         11/19/2022, 3:55 PM  Subjective:   Subjective    Brandi [name] is a 77 y.o.-year-old female patient being seen in follow up after being seen in consultation for hypothyroidism referred by Ephriam Jenkins E.  She would also like to be seen here for her diabetes management as well.   Past Medical History:  Diagnosis Date   Cancer ([place])    Diabetes mellitus ([place])    Hyperlipidemia    Hypertension    Thyroid disease     Past Surgical History:  Procedure Laterality Date   CYST EXCISION     1990    Social History   Socioeconomic History   Marital status: Married    Spouse name: Not on file   Number of children: Not on file   Years of education: Not on file   Highest education level: Not on file  Occupational History   Not on file  Tobacco Use   Smoking status: Never    Passive exposure: Past   Smokeless tobacco: Never  Vaping Use   Vaping Use: Never used  Substance and Sexual Activity   Alcohol use: Yes   Drug use: Never   Sexual activity: Not on file  Other Topics Concern   Not on file  Social History Narrative   Not on file   Social Determinants of Health   Financial Resource Strain: Not on file  Food Insecurity: Not on file  Transportation Needs: Not on file  Physical Activity: Not on file  Stress: Not on file  Social Connections: Not on file    Family History  Problem Relation Age of Onset   Hypertension Mother    Diabetes Mother    Hyperlipidemia Mother    Heart attack Mother    Hypertension Father    Stroke Father     Outpatient Encounter Medications as of 11/19/2022  Medication Sig   acetaminophen (TYLENOL) 500 MG tablet Take by mouth.   atorvastatin (LIPITOR) 20 MG tablet Take 20 mg by mouth daily.   citalopram (CELEXA) 10 MG tablet Take 10 mg by mouth daily.   ONETOUCH VERIO test strip USE TO TEST  BLOOD GLUCOSE 3 TIMES DAILY   propranolol (INDERAL) 20 MG tablet Take 1 tablet (20 mg total) by mouth 2 (two) times daily.   warfarin (COUMADIN) 5 MG tablet Take by mouth. Takes 2 1/2 every day except on Wednesdays and Sundays only takes '5mg'$    losartan (COZAAR) 100 MG tablet Take 100 mg by mouth daily. (Patient not taking: Reported on 10/14/2022)   predniSONE (DELTASONE) 10 MG tablet Take 1 tablet (10 mg total) by mouth daily with breakfast. (Patient not taking: Reported on 08/14/2022)   No facility-administered encounter medications on file as of 11/19/2022.    ALLERGIES: Allergies  Allergen Reactions   Penicillins Swelling   Sufentanil Swelling   Sulfa Antibiotics  Rash and Swelling   VACCINATION STATUS: Immunization History  Administered Date(s) Administered   Moderna Sars-Covid-2 Vaccination 01/13/2020, 02/10/2020, 10/10/2020     HPI  Thyroid Problem Presents for follow-up visit. Symptoms include anxiety, fatigue, palpitations (improved with Propanolol) and tremors. Patient reports no constipation, depressed mood, hair loss, leg swelling or weight gain. The symptoms have been stable.    Brandi [name]  is a patient with the above medical history. she was diagnosed with hypothyroidism at approximate age of 23 years, which required subsequent initiation of thyroid hormone replacement. she was given various doses of Levothyroxine over the years, currently on 25 (lowered between visits due to lab results) micrograms. She was taken off this medication last visit due to signs of over-replacement.  I reviewed patient's thyroid tests:  Lab Results  Component Value Date   TSH 0.01 (A) 11/12/2022   TSH 0.01 (A) 09/04/2022   TSH 0.01 (A) 09/04/2022   TSH 0.01 (A) 07/17/2022   TSH 0.01 (A) 06/16/2022   TSH <0.005 (L) 04/17/2022   TSH <0.005 (L) 12/16/2021   TSH 0.01 (A) 09/11/2021   FREET4 3.22 (H) 04/17/2022   FREET4 2.91 (H) 12/16/2021    Pt denies feeling nodules in neck,  hoarseness, dysphagia/odynophagia, SOB with lying down.  she does have family history of thyroid disorders in her sister (hypothyroidism).  No family history of thyroid cancer.  No history of radiation therapy to head or neck.  No recent use of iodine supplements.  Denies use of Biotin containing supplements.  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   Review of systems  Constitutional: + stable body weight,  current Body mass index is 30.37 kg/m. , + fatigue, + subjective hyperthermia, no subjective hypothermia Eyes: no blurry vision, no xerophthalmia ENT: no sore throat, no nodules palpated in throat, no dysphagia/odynophagia, no hoarseness Cardiovascular: no chest pain, no shortness of breath, intermittent palpitations, no leg swelling Respiratory: no cough, no shortness of breath Gastrointestinal: no nausea/vomiting, + diarrhea Musculoskeletal: no muscle/joint aches Skin: no rashes, no hyperemia Neurological: no tremors, no numbness, no tingling, no dizziness Psychiatric: no depression, + anxiety   Objective:   Objective     BP 115/74 (BP Location: Left Arm, Patient Position: Sitting, Cuff Size: Normal)   Pulse 91   Ht 5' 0.25" (1.53 m)   Wt 156 lb 12.8 oz (71.1 kg)   BMI 30.37 kg/m  Wt Readings from Last 3 Encounters:  11/19/22 156 lb 12.8 oz (71.1 kg)  10/14/22 156 lb (70.8 kg)  09/11/22 153 lb 3.2 oz (69.5 kg)    BP Readings from Last 3 Encounters:  11/19/22 115/74  10/14/22 118/77  09/11/22 110/60      Physical Exam- Limited  Constitutional:  Body mass index is 30.37 kg/m. , not in acute distress, normal state of mind Eyes:  EOMI, no exophthalmos Musculoskeletal: no gross deformities, strength intact in all four extremities, no gross restriction of joint movements    CMP ( most recent) CMP     Component Value Date/Time   NA 139  12/02/2021 0000   K 4.3 12/02/2021 0000   CL 106 12/02/2021 0000   CO2 25 (A) 12/02/2021 0000   BUN 22 (A) 12/02/2021 0000   CREATININE 10.7 (A) 12/02/2021 0000   CREATININE 0.89 02/16/2009 0905   CALCIUM 9.4 12/02/2021 0000   GFRNONAA >  60 02/16/2009 0905   GFRAA  02/16/2009 0905    >60        The eGFR has been calculated using the MDRD equation. This calculation has not been validated in all clinical situations. eGFR's persistently <60 mL/min signify possible Chronic Kidney Disease.     Diabetic Labs (most recent): Lab Results  Component Value Date   HGBA1C 8.9 (A) 11/19/2022   HGBA1C 7.1 06/18/2022   HGBA1C 6.5 03/26/2022   MICROALBUR 30 01/21/2022     Lipid Panel ( most recent) Lipid Panel     Component Value Date/Time   CHOL 131 12/02/2021 0000   TRIG 186 (A) 12/02/2021 0000   HDL 37 12/02/2021 0000   LDLCALC 57 12/02/2021 0000       Lab Results  Component Value Date   TSH 0.01 (A) 11/12/2022   TSH 0.01 (A) 09/04/2022   TSH 0.01 (A) 09/04/2022   TSH 0.01 (A) 07/17/2022   TSH 0.01 (A) 06/16/2022   TSH <0.005 (L) 04/17/2022   TSH <0.005 (L) 12/16/2021   TSH 0.01 (A) 09/11/2021   FREET4 3.22 (H) 04/17/2022   FREET4 2.91 (H) 12/16/2021     Latest Reference Range & Units 04/17/22 14:59 06/16/22 00:00 07/17/22 00:00 09/04/22 00:00  TSH 0.41 - 5.90  0.41 - 5.90  <0.005 (L) 0.01 ! (E) 0.01 ! (E) 0.01 ! (E) 0.01 ! (E)  T4,Free(Direct) 0.82 - 1.77 ng/dL 3.22 (H)     (L): Data is abnormally low !: Data is abnormal (H): Data is abnormally high (E): External lab result   Uptake and Scan from 10/08/22 CLINICAL DATA:  Hyperthyroidism, anxiety, weight loss, tremors, palpitations, increased tiredness, brittle nails, suppressed TSH   EXAM: THYROID SCAN AND UPTAKE - 4 AND 24 HOURS   TECHNIQUE: Following oral administration of I-123 capsule, anterior planar imaging was acquired at 24 hours. Thyroid uptake was calculated with a thyroid probe at 4-6 hours and  24 hours.   RADIOPHARMACEUTICALS:  427 uCi I-123 sodium iodide p.o.   COMPARISON:  None Available.   FINDINGS: Homogeneous tracer distribution in both thyroid lobes.   No focal areas of increased or decreased tracer localization.   4 hour I-123 uptake = 17.8% (normal 5-20%)   24 hour I-123 uptake = 40.4% (normal 10-30%)   IMPRESSION: Homogeneous tracer uptake within both thyroid lobes with elevated 24 hour radio iodine uptake of 40.4%.   Findings consistent with Graves disease.     Electronically Signed   By: Lavonia Dana M.D.   On: 10/08/2022 13:36  Assessment & Plan:   ASSESSMENT / PLAN:  1) Hyperthyroidism-r/t Graves disease  Her previsit thyroid function tests are consistent with over active thyroid gland despite stopping her thyroid hormone supplement which she was on for Hashimoto's thyroiditis.  She does have positive thyroid antibodies indicating she is genetically predisposed to have problems with her thyroid.  Her 4-hour uptake was normal at 17.8% and 24-hr uptake was high at 40.4%, consistent with Graves disease.  We discussed the preferred treatment for hyperthyroidism is RAI ablation.  There are medications such as Methimazole which can help slow the thyroid hormone production but it can have negative side effects such as bone marrow suppression and liver damage.  The likelihood of recurrence on such medications is high as well.  Given she is on Warfarin, she is not a good candidate for Methimazole or PTU as it may affect the efficacy of her blood thinner causing hemorrhage or clotting.  She did not  respond favorably to the Prednisone as it did not slow her thyroid down.  She also cannot take that long term due to her diabetes.  She is most concerned that stopping her thyroid will cause her to gain weight uncontrollably.  I did reassure her that thyroid hormone can be replaced relatively easy to help stabilize her weight.  She was previously on Levothyroxine and  tolerated it well, reassuring me she will also tolerate it once again.    She is still hesitant to move forward with the treatment despite her worsening thyroid values.  I expressed my concerned for her health, that if she does not act soon, she is at risk for life threatening complications such as thyroid storm.  She wants to discuss it yet again with her daughter and will let me know what she decides within 2 weeks.  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  She admits she did overdo it during the holiday season, and was recently on steroids to help with hyperthyroidism which can negatively impact her diabetes.    - Nutritional counseling repeated at each appointment due to patients tendency to fall back in to old habits.  - The patient admits there is a room for improvement in their diet and drink choices. -  Suggestion is made for the patient to avoid simple carbohydrates from their diet including Cakes, Sweet Desserts / Pastries, Ice Cream, Soda (diet and regular), Sweet Tea, Candies, Chips, Cookies, Sweet Pastries, Store Bought Juices, Alcohol in Excess of 1-2 drinks a day, Artificial Sweeteners, Coffee Creamer, and "Sugar-free" Products. This will help patient to have stable blood glucose profile and potentially avoid unintended weight gain.   - I encouraged the patient to switch to unprocessed or minimally processed complex starch and increased protein intake (animal or plant source), fruits, and vegetables.   - Patient is advised to stick to a routine mealtimes to eat 3 meals a day and avoid unnecessary snacks (to snack only to correct hypoglycemia).  She is not currently on any medications for her diabetes.  She is not a good candidate for Metformin given her CKD and has history of unpleasant side effects from it previously.  I encouraged her to buckle down with  her diet and increase her exercise when able to help prevent her from needing to start medication to control her glucose.       I spent 50 minutes in the care of the patient today including review of labs from [place], Lipids, Thyroid Function, Hematology (current and previous including abstractions from other facilities); face-to-face time discussing  her blood glucose readings/logs, discussing hypoglycemia and hyperglycemia episodes and symptoms, medications doses, her options of short and long term treatment based on the latest standards of care / guidelines;  discussion about incorporating lifestyle medicine;  and documenting the encounter. Risk reduction counseling performed per USPSTF guidelines to reduce obesity and cardiovascular risk factors.     Please refer to Patient Instructions for Blood Glucose Monitoring and Insulin/Medications Dosing Guide"  in media tab for additional information. Please  also refer to " Patient Self Inventory" in the Media  tab for reviewed elements of pertinent patient history.  Brandi [name] participated in the discussions, expressed understanding, and voiced agreement with the above plans.  All questions were answered to her satisfaction. she is encouraged to contact clinic should she  have any questions or concerns prior to her return visit.    FOLLOW UP PLAN:  Return in about 3 months (around 02/18/2023) for Diabetes F/U with A1c in office, No previsit labs.  Rayetta Pigg, [facility] [address] [place], [place] [address] Phone: [phone] Fax: [phone]  11/19/2022, 3:55 PM

## 2022-11-25 ENCOUNTER — Other Ambulatory Visit: Payer: Self-pay | Admitting: Nurse Practitioner

## 2022-11-25 DIAGNOSIS — E0590 Thyrotoxicosis, unspecified without thyrotoxic crisis or storm: Secondary | ICD-10-CM

## 2022-11-25 NOTE — Progress Notes (Signed)
Patient called back with more questions regarding my recommendations for RAI ablation.  I answered her questions to her satisfaction and she decided to proceed with the ablation therapy.  I entered the order.

## 2022-12-02 NOTE — Written Directive (Cosign Needed)
MOLECULAR IMAGING AND THERAPEUTICS WRITTEN DIRECTIVE   PATIENT NAME: Brandi [name]  PT DOB:   08-Feb-1946                                              MRN: [id number]  ---------------------------------------------------------------------------------------------------------------------   I-131 WHOLE THYROID THERAPY (NON-CANCER)    RADIOPHARMACEUTICAL:   Iodine-131 Capsule    PRESCRIBED DOSE FOR ADMINISTRATION: 20 mCi  (twenty milliCuries)   ROUTE OFADMINISTRATION: PO   DIAGNOSIS:  Graves Disease    REFERRING PHYSICIAN: Whitney Reardon/ Dr. Kern Alberta   TSH:    Lab Results  Component Value Date   TSH 0.01 (A) 11/12/2022   TSH 0.01 (A) 09/04/2022   TSH 0.01 (A) 09/04/2022     PRIOR I-131 THERAPY (Date and Dose):N/A   PRIOR RADIOLOGY EXAMS (Results and Date): NM THYROID MULT UPTAKE W/IMAGING  Result Date: 10/08/2022 CLINICAL DATA:  Hyperthyroidism, anxiety, weight loss, tremors, palpitations, increased tiredness, brittle nails, suppressed TSH EXAM: THYROID SCAN AND UPTAKE - 4 AND 24 HOURS TECHNIQUE: Following oral administration of I-123 capsule, anterior planar imaging was acquired at 24 hours. Thyroid uptake was calculated with a thyroid probe at 4-6 hours and 24 hours. RADIOPHARMACEUTICALS:  427 uCi I-123 sodium iodide p.o. COMPARISON:  None Available. FINDINGS: Homogeneous tracer distribution in both thyroid lobes. No focal areas of increased or decreased tracer localization. 4 hour I-123 uptake = 17.8% (normal 5-20%) 24 hour I-123 uptake = 40.4% (normal 10-30%) IMPRESSION: Homogeneous tracer uptake within both thyroid lobes with elevated 24 hour radio iodine uptake of 40.4%. Findings consistent with Graves disease. Electronically Signed   By: Lavonia Dana M.D.   On: 10/08/2022 13:36      ADDITIONAL PHYSICIAN COMMENTS/NOTES   AUTHORIZED USER SIGNATURE & TIME STAMP:

## 2022-12-08 ENCOUNTER — Ambulatory Visit (HOSPITAL_COMMUNITY): Payer: Medicare HMO

## 2022-12-10 ENCOUNTER — Ambulatory Visit (HOSPITAL_COMMUNITY)
Admission: RE | Admit: 2022-12-10 | Discharge: 2022-12-10 | Disposition: A | Discharge status: Home / Self Care | Payer: Medicare HMO | Source: Ambulatory Visit | Attending: Nurse Practitioner | Admitting: Nurse Practitioner

## 2022-12-10 ENCOUNTER — Encounter (HOSPITAL_COMMUNITY): Payer: Self-pay

## 2022-12-10 DIAGNOSIS — E0590 Thyrotoxicosis, unspecified without thyrotoxic crisis or storm: Secondary | ICD-10-CM | POA: Diagnosis not present

## 2022-12-10 MED ORDER — SODIUM IODIDE I 131 CAPSULE
20.0000 | Freq: Once | INTRAVENOUS | Status: AC | PRN
  Administered 2022-12-10: 20.3 via ORAL

## 2022-12-22 ENCOUNTER — Telehealth: Payer: Self-pay | Admitting: Nurse Practitioner

## 2022-12-22 MED ORDER — PREDNISONE 10 MG PO TABS: 10.0000 mg | ORAL_TABLET | Freq: Every day | ORAL | 0 refills | Status: DC

## 2022-12-22 NOTE — Telephone Encounter (Signed)
Pt called and said that she had her RAI on 2/11 and ever since then she has had a headache, soreness in her neck, losing her voice, throat hurts. She asked if you had any recommendations.

## 2022-12-22 NOTE — Telephone Encounter (Signed)
It is not uncommon to have those symptoms following RAI ablation.  Usually all she needs is time for this to correct on its own.  She can try over the counter tylenol for minor aches and pains.  If that does not work, we can give short course of oral steroids to help with the inflammation as well (but want to try to avoid this if possible due to diabetes).

## 2022-12-22 NOTE — Telephone Encounter (Signed)
Talked with the patient. She states that she would like to precede with the steroids as she has been on the tylenol and it is not helping. She also shares that she cannot eat. The taste in her mouth is bad, the smell of foods make her sick. She would like to know how long this is going to last.  She also shares that she needs to make appointment for post ablation, and it was to have been 4 weeks after her RAI. Patient was told that the front staff would call her with that.   Medication to be called to the [facility] on [address] in [place], [place].

## 2022-12-22 NOTE — Telephone Encounter (Signed)
Not yet, I want to give the prednisone a chance to work and see if it helps her symptoms before bringing her in prematurely.

## 2022-12-22 NOTE — Telephone Encounter (Signed)
I just sent her in a script for Prednisone.  These symptoms she is experiencing should be short-lived, usually no more than 2-3 weeks following the treatment and the prednisone should help.  Tell her to hang in there as best she can.

## 2022-12-31 ENCOUNTER — Other Ambulatory Visit: Payer: Self-pay | Admitting: Nurse Practitioner

## 2023-01-05 ENCOUNTER — Telehealth: Payer: Self-pay | Admitting: Nurse Practitioner

## 2023-01-05 DIAGNOSIS — E0590 Thyrotoxicosis, unspecified without thyrotoxic crisis or storm: Secondary | ICD-10-CM

## 2023-01-05 NOTE — Telephone Encounter (Signed)
Pt stated she had done her therapy and was told to come back within 4 weeks.  She is scheduled for 4/17.  Do you want me to move her up and overbook?    She also stated the hospital told her she needed to do blood work every 4 weeks so does she need to do more blood work?

## 2023-01-05 NOTE — Telephone Encounter (Signed)
Usually 6 weeks after procedure is standard which would be around 3/20 or so.  Yes, we can over-book.  She will need repeat labs about 1 week prior to the appt to assess her thyroid function.

## 2023-01-05 NOTE — Telephone Encounter (Signed)
Called pt to move her appt up and let her know to get labs done a week prior to appointment.

## 2023-01-29 ENCOUNTER — Encounter: Payer: Self-pay | Admitting: Nurse Practitioner

## 2023-01-29 ENCOUNTER — Ambulatory Visit: Payer: Medicare HMO | Admitting: Nurse Practitioner

## 2023-01-29 VITALS — BP 117/82 | HR 75 | Ht 60.25 in | Wt 156.2 lb

## 2023-01-29 DIAGNOSIS — E0590 Thyrotoxicosis, unspecified without thyrotoxic crisis or storm: Secondary | ICD-10-CM

## 2023-01-29 DIAGNOSIS — I10 Essential (primary) hypertension: Secondary | ICD-10-CM | POA: Diagnosis not present

## 2023-01-29 DIAGNOSIS — E559 Vitamin D deficiency, unspecified: Secondary | ICD-10-CM | POA: Diagnosis not present

## 2023-01-29 DIAGNOSIS — E1122 Type 2 diabetes mellitus with diabetic chronic kidney disease: Secondary | ICD-10-CM | POA: Diagnosis not present

## 2023-01-29 DIAGNOSIS — E890 Postprocedural hypothyroidism: Secondary | ICD-10-CM

## 2023-01-29 DIAGNOSIS — N1831 Chronic kidney disease, stage 3a: Secondary | ICD-10-CM

## 2023-01-29 MED ORDER — LEVOTHYROXINE SODIUM 25 MCG PO TABS: 25.0000 ug | ORAL_TABLET | Freq: Every day | ORAL | 0 refills | Status: DC

## 2023-01-29 NOTE — Progress Notes (Signed)
Endocrinology Follow Up Note                                         01/29/2023, 10:33 AM  Subjective:   Subjective    Brandi [name] is a 77 y.o.-year-old female patient being seen in follow up after being seen in consultation for abnormal thyroid function referred by Ephriam Jenkins E.  She would also like to be seen here for her diabetes management as well.   Past Medical History:  Diagnosis Date   Cancer ([facility])    Diabetes mellitus ([place])    Hyperlipidemia    Hypertension    Thyroid disease     Past Surgical History:  Procedure Laterality Date   CYST EXCISION     1990    Social History   Socioeconomic History   Marital status: Married    Spouse name: Not on file   Number of children: Not on file   Years of education: Not on file   Highest education level: Not on file  Occupational History   Not on file  Tobacco Use   Smoking status: Never    Passive exposure: Past   Smokeless tobacco: Never  Vaping Use   Vaping Use: Never used  Substance and Sexual Activity   Alcohol use: Yes   Drug use: Never   Sexual activity: Not on file  Other Topics Concern   Not on file  Social History Narrative   Not on file   Social Determinants of Health   Financial Resource Strain: Not on file  Food Insecurity: Not on file  Transportation Needs: Not on file  Physical Activity: Not on file  Stress: Not on file  Social Connections: Not on file    Family History  Problem Relation Age of Onset   Hypertension Mother    Diabetes Mother    Hyperlipidemia Mother    Heart attack Mother    Hypertension Father    Stroke Father     Outpatient Encounter Medications as of 01/29/2023  Medication Sig   acetaminophen (TYLENOL) 500 MG tablet Take by mouth.   atorvastatin (LIPITOR) 20 MG tablet Take 20 mg by mouth daily.   citalopram (CELEXA) 10 MG tablet Take 10 mg by mouth daily.   levothyroxine (SYNTHROID)  25 MCG tablet Take 1 tablet (25 mcg total) by mouth daily before breakfast.   ONETOUCH VERIO test strip USE TO TEST BLOOD GLUCOSE 3 TIMES DAILY   propranolol (INDERAL) 20 MG tablet TAKE 1 TABLET BY MOUTH TWICE A DAY   warfarin (COUMADIN) 5 MG tablet Take by mouth. Takes 2 1/2 every day except on Wednesdays and Sundays only takes 5mg    losartan (COZAAR) 100 MG tablet Take 100 mg by mouth daily. (Patient not taking: Reported on 10/14/2022)   predniSONE (DELTASONE) 10 MG tablet Take 1 tablet (10 mg total) by mouth daily with breakfast. (Patient not taking: Reported on 01/29/2023)   No facility-administered encounter medications on file as of 01/29/2023.    ALLERGIES:  Allergies  Allergen Reactions   Penicillins Swelling   Sufentanil Swelling   Sulfa Antibiotics Rash and Swelling   VACCINATION STATUS: Immunization History  Administered Date(s) Administered   Moderna Sars-Covid-2 Vaccination 01/13/2020, 02/10/2020, 10/10/2020     HPI  Thyroid Problem Presents for follow-up visit. Symptoms include anxiety, fatigue, palpitations (improved with Propanolol) and tremors. Patient reports no constipation, depressed mood, hair loss, leg swelling or weight gain. The symptoms have been stable.    Brandi [name]  is a patient with the above medical history. she was diagnosed with hypothyroidism at approximate age of 59 years, which required subsequent initiation of thyroid hormone replacement. she was given various doses of Levothyroxine over the years, currently on 25 (lowered between visits due to lab results) micrograms. She was taken off this medication last visit due to signs of over-replacement.  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  After long discussion over several different visits, she reluctantly moved forward with RAI ablation (was not a good candidate for Methimazole due to coumadin use for Factor V Leiden  deficiency or surgery).  Her biggest concern was whether or not she would gain weight after treatment.  She had RAI on 12/10/22.  I reviewed patient's thyroid tests:  Lab Results  Component Value Date   TSH 0.01 (A) 11/12/2022   TSH 0.01 (A) 09/04/2022   TSH 0.01 (A) 09/04/2022   TSH 0.01 (A) 07/17/2022   TSH 0.01 (A) 06/16/2022   TSH <0.005 (L) 04/17/2022   TSH <0.005 (L) 12/16/2021   TSH 0.01 (A) 09/11/2021   FREET4 3.22 (H) 04/17/2022   FREET4 2.91 (H) 12/16/2021    Pt denies feeling nodules in neck, hoarseness, dysphagia/odynophagia, SOB with lying down.  she does have family history of thyroid disorders in her sister (hypothyroidism).  No family history of thyroid cancer.   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   Review of systems  Constitutional: + Minimally fluctuating body weight,  current Body mass index is 30.25 kg/m. , + fatigue, no subjective hyperthermia, no subjective hypothermia Eyes: no blurry vision, no xerophthalmia ENT: no sore throat, no nodules palpated in throat, no dysphagia/odynophagia, no hoarseness Cardiovascular: no chest pain, no shortness of breath, no palpitations (resolved after treated with Prednisone and Propanolol following RAI ablation), no leg swelling Respiratory: no cough, no shortness of breath Gastrointestinal: no nausea/vomiting/diarrhea Musculoskeletal: no muscle/joint aches Skin: no rashes, no hyperemia Neurological: no tremors, no numbness, no tingling, no dizziness Psychiatric: no depression, no anxiety   Objective:   Objective     BP 117/82 (BP Location: Left Arm, Patient Position: Sitting, Cuff Size: Normal)   Pulse 75   Ht 5' 0.25" (1.53 m)   Wt 156 lb 3.2 oz (70.9 kg)   BMI 30.25 kg/m  Wt Readings from Last 3 Encounters:  01/29/23 156 lb 3.2 oz (70.9 kg)  11/19/22 156 lb 12.8 oz (71.1 kg)  10/14/22 156 lb (70.8 kg)    BP Readings from Last 3  Encounters:  01/29/23 117/82  11/19/22 115/74  10/14/22 118/77      Physical Exam- Limited  Constitutional:  Body mass index is 30.25 kg/m. , not in acute distress, normal state of mind Eyes:  EOMI, no exophthalmos Cardiovascular: RRR, no murmurs, rubs, or gallops, no edema Musculoskeletal: no gross deformities, strength intact in all four extremities, no gross restriction of  joint movements Skin:  no rashes, no hyperemia Neurological: no tremor with outstretched hands    CMP ( most recent) CMP     Component Value Date/Time   NA 139 12/02/2021 0000   K 4.3 12/02/2021 0000   CL 106 12/02/2021 0000   CO2 25 (A) 12/02/2021 0000   BUN 22 (A) 12/02/2021 0000   CREATININE 10.7 (A) 12/02/2021 0000   CREATININE 0.89 02/16/2009 0905   CALCIUM 9.4 12/02/2021 0000   GFRNONAA >60 02/16/2009 0905   GFRAA  02/16/2009 0905    >60        The eGFR has been calculated using the MDRD equation. This calculation has not been validated in all clinical situations. eGFR's persistently <60 mL/min signify possible Chronic Kidney Disease.     Diabetic Labs (most recent): Lab Results  Component Value Date   HGBA1C 8.9 (A) 11/19/2022   HGBA1C 7.1 06/18/2022   HGBA1C 6.5 03/26/2022   MICROALBUR 30 01/21/2022     Lipid Panel ( most recent) Lipid Panel     Component Value Date/Time   CHOL 131 12/02/2021 0000   TRIG 186 (A) 12/02/2021 0000   HDL 37 12/02/2021 0000   LDLCALC 57 12/02/2021 0000       Lab Results  Component Value Date   TSH 0.01 (A) 11/12/2022   TSH 0.01 (A) 09/04/2022   TSH 0.01 (A) 09/04/2022   TSH 0.01 (A) 07/17/2022   TSH 0.01 (A) 06/16/2022   TSH <0.005 (L) 04/17/2022   TSH <0.005 (L) 12/16/2021   TSH 0.01 (A) 09/11/2021   FREET4 3.22 (H) 04/17/2022   FREET4 2.91 (H) 12/16/2021     Uptake and Scan from 10/08/22 CLINICAL DATA:  Hyperthyroidism, anxiety, weight loss, tremors, palpitations, increased tiredness, brittle nails, suppressed TSH    EXAM: THYROID SCAN AND UPTAKE - 4 AND 24 HOURS   TECHNIQUE: Following oral administration of I-123 capsule, anterior planar imaging was acquired at 24 hours. Thyroid uptake was calculated with a thyroid probe at 4-6 hours and 24 hours.   RADIOPHARMACEUTICALS:  427 uCi I-123 sodium iodide p.o.   COMPARISON:  None Available.   FINDINGS: Homogeneous tracer distribution in both thyroid lobes.   No focal areas of increased or decreased tracer localization.   4 hour I-123 uptake = 17.8% (normal 5-20%)   24 hour I-123 uptake = 40.4% (normal 10-30%)   IMPRESSION: Homogeneous tracer uptake within both thyroid lobes with elevated 24 hour radio iodine uptake of 40.4%.   Findings consistent with Graves disease.     Electronically Signed   By: Lavonia Dana M.D.   On: 10/08/2022 13:36    Latest Reference Range & Units 04/17/22 14:59 06/16/22 00:00 07/17/22 00:00 09/04/22 00:00 11/12/22 00:00  TSH 0.41 - 5.90  <0.005 (L) 0.01 ! (E) 0.01 ! (E) 0.01 ! (E) 0.01 ! (E) 0.01 ! (E)  T4,Free(Direct) 0.82 - 1.77 ng/dL 3.22 (H)      (L): Data is abnormally low !: Data is abnormal (H): Data is abnormally high (E): External lab result   Assessment & Plan:   ASSESSMENT / PLAN:  1) Hyperthyroidism-r/t Graves disease  She had RAI ablation on 12/10/22, immediately following the treatment, she did have significant worsening of symptoms and was treated with short course of oral steroids as well as Propanolol.   The symptoms she experienced were rapid weight loss, decreased appetite, debilitating fatigue, dry mouth, and trouble swallowing.  She notes she ended up quitting her job as the symptoms were  so bad.  These have all significantly improved as time has gone on.  Her most recent labs show adequate response to RAI ablation, now ready to start low dose thyroid hormone replacement therapy.  She is advised to start Levothyroxine 25 mcg po daily before breakfast.  She will need frequent labs to  adjust hormone appropriately during this time after ablation.   - The correct intake of thyroid hormone (Levothyroxine, Synthroid), is on empty stomach first thing in the morning, with water, separated by at least 30 minutes from breakfast and other medications,  and separated by more than 4 hours from calcium, iron, multivitamins, acid reflux medications (PPIs).  - This medication is a life-long medication and will be needed to correct thyroid hormone imbalances for the rest of your life.  The dose may change from time to time, based on thyroid blood work.  - It is extremely important to be consistent taking this medication, near the same time each morning.  -AVOID TAKING PRODUCTS CONTAINING BIOTIN (commonly found in Hair, Skin, Nails vitamins) AS IT INTERFERES WITH THE VALIDITY OF THYROID FUNCTION BLOOD TESTS.  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  She admits she did overdo it during the holiday season, and was recently on steroids to help with hyperthyroidism which can negatively impact her diabetes.   Will recheck this at next visit.  - Nutritional counseling repeated at each appointment due to patients tendency to fall back in to old habits.  - The patient admits there is a room for improvement in their diet and drink choices. -  Suggestion is made for the patient to avoid simple carbohydrates from their diet including Cakes, Sweet Desserts / Pastries, Ice Cream, Soda (diet and regular), Sweet Tea, Candies, Chips, Cookies, Sweet Pastries, Store Bought Juices, Alcohol in Excess of 1-2 drinks a day, Artificial Sweeteners, Coffee Creamer, and "Sugar-free" Products. This will help patient to have stable blood glucose profile and potentially avoid unintended weight gain.   - I encouraged the patient to switch to unprocessed or minimally processed complex starch and  increased protein intake (animal or plant source), fruits, and vegetables.   - Patient is advised to stick to a routine mealtimes to eat 3 meals a day and avoid unnecessary snacks (to snack only to correct hypoglycemia).  She is not currently on any medications for her diabetes.  She is not a good candidate for Metformin given her CKD and has history of unpleasant side effects from it previously.  I encouraged her to buckle down with her diet and increase her exercise when able to help prevent her from needing to start medication to control her glucose.       I spent  37  minutes in the care of the patient today including review of labs from [facility], Lipids, Thyroid Function, Hematology (current and previous including abstractions from other facilities); face-to-face time discussing  her blood glucose readings/logs, discussing hypoglycemia and hyperglycemia episodes and symptoms, medications doses, her options of short and long term treatment based on the latest standards of care / guidelines;  discussion about incorporating lifestyle medicine;  and documenting the encounter. Risk reduction counseling performed per USPSTF guidelines to reduce obesity and cardiovascular risk factors.     Please refer to Patient Instructions for Blood Glucose Monitoring and Insulin/Medications Dosing Guide"  in media tab for additional information. Please  also refer to " Patient Self Inventory" in the Media  tab for reviewed elements of pertinent patient history.  Brandi [name] participated in the discussions, expressed understanding, and voiced agreement with the above plans.  All questions were answered to her satisfaction. she is encouraged to contact clinic should she have any questions or concerns prior to her return visit.    FOLLOW UP PLAN:  Return in about 6 weeks (around 03/12/2023) for Thyroid follow up, Previsit labs.  Rayetta Pigg, [facility] [address] [place], [place] [address] Phone: [phone] Fax: [phone]  01/29/2023, 10:33 AM

## 2023-01-29 NOTE — Patient Instructions (Signed)

## 2023-02-18 ENCOUNTER — Ambulatory Visit: Payer: Medicare HMO | Admitting: Nurse Practitioner

## 2023-03-19 ENCOUNTER — Ambulatory Visit: Payer: Medicare HMO | Admitting: Nurse Practitioner

## 2023-03-25 ENCOUNTER — Encounter: Payer: Self-pay | Admitting: Nurse Practitioner

## 2023-03-25 ENCOUNTER — Ambulatory Visit: Payer: Medicare HMO | Admitting: Nurse Practitioner

## 2023-03-25 VITALS — BP 125/84 | HR 84 | Ht 60.25 in | Wt 164.0 lb

## 2023-03-25 DIAGNOSIS — E1122 Type 2 diabetes mellitus with diabetic chronic kidney disease: Secondary | ICD-10-CM | POA: Diagnosis not present

## 2023-03-25 DIAGNOSIS — I10 Essential (primary) hypertension: Secondary | ICD-10-CM

## 2023-03-25 DIAGNOSIS — N1831 Chronic kidney disease, stage 3a: Secondary | ICD-10-CM

## 2023-03-25 DIAGNOSIS — E890 Postprocedural hypothyroidism: Secondary | ICD-10-CM

## 2023-03-25 DIAGNOSIS — E782 Mixed hyperlipidemia: Secondary | ICD-10-CM

## 2023-03-25 DIAGNOSIS — E559 Vitamin D deficiency, unspecified: Secondary | ICD-10-CM

## 2023-03-25 LAB — POCT UA - MICROALBUMIN
Creatinine, POC: 200 mg/dL
Microalbumin Ur, POC: 80 mg/L

## 2023-03-25 LAB — POCT GLYCOSYLATED HEMOGLOBIN (HGB A1C): Hemoglobin A1C: 9.8 % — AB (ref 4.0–5.6)

## 2023-03-25 MED ORDER — LOSARTAN POTASSIUM 25 MG PO TABS: 25.0000 mg | ORAL_TABLET | Freq: Every day | ORAL | 0 refills | Status: DC

## 2023-03-25 MED ORDER — LEVOTHYROXINE SODIUM 75 MCG PO TABS: 75.0000 ug | ORAL_TABLET | Freq: Every day | ORAL | 0 refills | Status: DC

## 2023-03-25 NOTE — Progress Notes (Signed)
Endocrinology Follow Up Note                                         03/25/2023, 8:52 AM  Subjective:   Subjective    Brandi [name] is a 77 y.o.-year-old female patient being seen in follow up after being seen in consultation for abnormal thyroid function referred by Richarda Blade E.  She would also like to be seen here for her diabetes management as well.   Past Medical History:  Diagnosis Date   Cancer (HCC)    Diabetes mellitus (HCC)    Hyperlipidemia    Hypertension    Thyroid disease     Past Surgical History:  Procedure Laterality Date   CYST EXCISION     1990    Social History   Socioeconomic History   Marital status: Married    Spouse name: Not on file   Number of children: Not on file   Years of education: Not on file   Highest education level: Not on file  Occupational History   Not on file  Tobacco Use   Smoking status: Never    Passive exposure: Past   Smokeless tobacco: Never  Vaping Use   Vaping Use: Never used  Substance and Sexual Activity   Alcohol use: Yes   Drug use: Never   Sexual activity: Not on file  Other Topics Concern   Not on file  Social History Narrative   Not on file   Social Determinants of Health   Financial Resource Strain: Not on file  Food Insecurity: Not on file  Transportation Needs: Not on file  Physical Activity: Not on file  Stress: Not on file  Social Connections: Not on file    Family History  Problem Relation Age of Onset   Hypertension Mother    Diabetes Mother    Hyperlipidemia Mother    Heart attack Mother    Hypertension Father    Stroke Father     Outpatient Encounter Medications as of 03/25/2023  Medication Sig   acetaminophen (TYLENOL) 500 MG tablet Take by mouth.   atorvastatin (LIPITOR) 20 MG tablet Take 20 mg by mouth daily.   citalopram (CELEXA) 10 MG tablet Take 10 mg by mouth daily.   losartan (COZAAR) 25 MG  tablet Take 1 tablet (25 mg total) by mouth daily.   ONETOUCH VERIO test strip USE TO TEST [name] GLUCOSE 3 TIMES DAILY   propranolol (INDERAL) 20 MG tablet TAKE 1 TABLET BY MOUTH TWICE A DAY   warfarin (COUMADIN) 5 MG tablet Take by mouth. Takes 2 1/2 every day except on Wednesdays and Sundays only takes 5mg    [DISCONTINUED] levothyroxine (SYNTHROID) 25 MCG tablet Take 1 tablet (25 mcg total) by mouth daily before breakfast.   levothyroxine (SYNTHROID) 75 MCG tablet Take 1 tablet (75 mcg total) by mouth daily before breakfast.   [DISCONTINUED] losartan (COZAAR) 100 MG tablet Take 100 mg by mouth daily. (Patient not taking: Reported on 10/14/2022)   [DISCONTINUED]  predniSONE (DELTASONE) 10 MG tablet Take 1 tablet (10 mg total) by mouth daily with breakfast. (Patient not taking: Reported on 01/29/2023)   No facility-administered encounter medications on file as of 03/25/2023.    ALLERGIES: Allergies  Allergen Reactions   Penicillins Swelling   Sufentanil Swelling   Sulfa Antibiotics Rash and Swelling   VACCINATION STATUS: Immunization History  Administered Date(s) Administered   Moderna Sars-Covid-2 Vaccination 01/13/2020, 02/10/2020, 10/10/2020     HPI  Thyroid Problem Presents for follow-up visit. Symptoms include fatigue and palpitations (improved with Propanolol). Patient reports no constipation, depressed mood, hair loss, leg swelling or weight gain. The symptoms have been stable.  Diabetes She presents for her follow-up diabetic visit. She has type 2 diabetes mellitus. Onset time: diagnosed at approx age of 37. Her disease course has been stable. There are no hypoglycemic associated symptoms. Associated symptoms include fatigue. There are no hypoglycemic complications.    Brandi [name]  is a patient with the above medical history. she was diagnosed with hypothyroidism at approximate age of 46 years, which required subsequent initiation of thyroid hormone replacement. she was  given various doses of Levothyroxine over the years, currently on 25 (lowered between visits due to lab results) micrograms. She was taken off this medication last visit due to signs of over-replacement.  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  After long discussion over several different visits, she reluctantly moved forward with RAI ablation (was not a good candidate for Methimazole due to coumadin use for Factor V Leiden deficiency or surgery).  Her biggest concern was whether or not she would gain weight after treatment.  She had RAI on 12/10/22.  I reviewed patient's thyroid tests:  Lab Results  Component Value Date   TSH 0.01 (A) 11/12/2022   TSH 0.01 (A) 09/04/2022   TSH 0.01 (A) 09/04/2022   TSH 0.01 (A) 07/17/2022   TSH 0.01 (A) 06/16/2022   TSH <0.005 (L) 04/17/2022   TSH <0.005 (L) 12/16/2021   TSH 0.01 (A) 09/11/2021   FREET4 3.22 (H) 04/17/2022   FREET4 2.91 (H) 12/16/2021    Pt denies feeling nodules in neck, hoarseness, dysphagia/odynophagia, SOB with lying down.  she does have family history of thyroid disorders in her sister (hypothyroidism).  No family history of thyroid cancer.   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   Review of systems  Constitutional: + increasing body weight,  current Body mass index is 31.76 kg/m. , + fatigue, no subjective hyperthermia, no subjective hypothermia Eyes: no blurry vision, no xerophthalmia ENT: no sore throat, no nodules palpated in throat, no dysphagia/odynophagia, no hoarseness Cardiovascular: no chest pain, no shortness of breath, no palpitations, no leg swelling Respiratory: no cough, no shortness of breath Gastrointestinal: no nausea/vomiting/diarrhea Musculoskeletal: + diffuse muscle/joint aches Skin: no rashes, no hyperemia Neurological: no tremors, no numbness, no tingling, +  dizziness/unsteadiness Psychiatric: no depression, no anxiety   Objective:   Objective     BP 125/84 (BP Location: Left Arm, Patient Position: Sitting, Cuff Size: Large)   Pulse 84   Ht 5' 0.25" (1.53 m)   Wt 164 lb (74.4 kg)   BMI 31.76 kg/m  Wt Readings from Last 3 Encounters:  03/25/23 164 lb (74.4 kg)  01/29/23 156 lb 3.2 oz (70.9 kg)  11/19/22 156 lb 12.8 oz (71.1 kg)    BP Readings  from Last 3 Encounters:  03/25/23 125/84  01/29/23 117/82  11/19/22 115/74      Physical Exam- Limited  Constitutional:  Body mass index is 31.76 kg/m. , not in acute distress, normal state of mind Eyes:  EOMI, no exophthalmos Musculoskeletal: no gross deformities, strength intact in all four extremities, no gross restriction of joint movements Skin:  no rashes, no hyperemia Neurological: no tremor with outstretched hands    CMP ( most recent) CMP     Component Value Date/Time   NA 139 12/02/2021 0000   K 4.3 12/02/2021 0000   CL 106 12/02/2021 0000   CO2 25 (A) 12/02/2021 0000   BUN 22 (A) 12/02/2021 0000   CREATININE 10.7 (A) 12/02/2021 0000   CREATININE 0.89 02/16/2009 0905   CALCIUM 9.4 12/02/2021 0000   GFRNONAA >60 02/16/2009 0905   GFRAA  02/16/2009 0905    >60        The eGFR has been calculated using the MDRD equation. This calculation has not been validated in all clinical situations. eGFR's persistently <60 mL/min signify possible Chronic Kidney Disease.     Diabetic Labs (most recent): Lab Results  Component Value Date   HGBA1C 9.8 (A) 03/25/2023   HGBA1C 8.9 (A) 11/19/2022   HGBA1C 7.1 06/18/2022   MICROALBUR 80 mg/L 03/25/2023   MICROALBUR 30 01/21/2022     Lipid Panel ( most recent) Lipid Panel     Component Value Date/Time   CHOL 131 12/02/2021 0000   TRIG 186 (A) 12/02/2021 0000   HDL 37 12/02/2021 0000   LDLCALC 57 12/02/2021 0000       Lab Results  Component Value Date   TSH 0.01 (A) 11/12/2022   TSH 0.01 (A) 09/04/2022    TSH 0.01 (A) 09/04/2022   TSH 0.01 (A) 07/17/2022   TSH 0.01 (A) 06/16/2022   TSH <0.005 (L) 04/17/2022   TSH <0.005 (L) 12/16/2021   TSH 0.01 (A) 09/11/2021   FREET4 3.22 (H) 04/17/2022   FREET4 2.91 (H) 12/16/2021     Uptake and Scan from 10/08/22 CLINICAL DATA:  Hyperthyroidism, anxiety, weight loss, tremors, palpitations, increased tiredness, brittle nails, suppressed TSH   EXAM: THYROID SCAN AND UPTAKE - 4 AND 24 HOURS   TECHNIQUE: Following oral administration of I-123 capsule, anterior planar imaging was acquired at 24 hours. Thyroid uptake was calculated with a thyroid probe at 4-6 hours and 24 hours.   RADIOPHARMACEUTICALS:  427 uCi I-123 sodium iodide p.o.   COMPARISON:  None Available.   FINDINGS: Homogeneous tracer distribution in both thyroid lobes.   No focal areas of increased or decreased tracer localization.   4 hour I-123 uptake = 17.8% (normal 5-20%)   24 hour I-123 uptake = 40.4% (normal 10-30%)   IMPRESSION: Homogeneous tracer uptake within both thyroid lobes with elevated 24 hour radio iodine uptake of 40.4%.   Findings consistent with Graves disease.     Electronically Signed   By: Ulyses Southward M.D.   On: 10/08/2022 13:36    Latest Reference Range & Units 04/17/22 14:59 06/16/22 00:00 07/17/22 00:00 09/04/22 00:00 11/12/22 00:00  TSH 0.41 - 5.90  <0.005 (L) 0.01 ! (E) 0.01 ! (E) 0.01 ! (E) 0.01 ! (E) 0.01 ! (E)  T4,Free(Direct) 0.82 - 1.77 ng/dL 1.61 (H)      (L): Data is abnormally low !: Data is abnormal (H): Data is abnormally high (E): External lab result        Assessment & Plan:   ASSESSMENT /  PLAN:  1) Hyperthyroidism-r/t Graves disease  She had RAI ablation on 12/10/22, immediately following the treatment, she did have significant worsening of symptoms and was treated with short course of oral steroids as well as Propanolol.   The symptoms she experienced were rapid weight loss, decreased appetite, debilitating fatigue,  dry mouth, and trouble swallowing.  She notes she ended up quitting her job as the symptoms were so bad.  These have all significantly improved as time has gone on.  Her previsit thyroid function tests are consistent with under-replacement.  She is advised to increase her Levothyroxine to 75 mcg po daily before breakfast (can take 3 of her 25 mcg tabs until depletes current supply).   - The correct intake of thyroid hormone (Levothyroxine, Synthroid), is on empty stomach first thing in the morning, with water, separated by at least 30 minutes from breakfast and other medications,  and separated by more than 4 hours from calcium, iron, multivitamins, acid reflux medications (PPIs).  - This medication is a life-long medication and will be needed to correct thyroid hormone imbalances for the rest of your life.  The dose may change from time to time, based on thyroid [name] work.  - It is extremely important to be consistent taking this medication, near the same time each morning.  -AVOID TAKING PRODUCTS CONTAINING BIOTIN (commonly found in Hair, Skin, Nails vitamins) AS IT INTERFERES WITH THE VALIDITY OF THYROID FUNCTION [name] TESTS.  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  This is most likely due to her intermittent steroid usage for thyroid problems.  She prefers to try lifestyle modifications before going on medications which I do think is reasonable.  - Nutritional counseling repeated at each appointment due to patients tendency to fall back in to old habits.  - The patient admits there is a room for improvement in their diet and drink choices. -  Suggestion is made for the patient to avoid simple carbohydrates from their diet including Cakes, Sweet Desserts / Pastries, Ice Cream, Soda (diet and regular), Sweet Tea, Candies, Chips, Cookies, Sweet Pastries, Store  Bought Juices, Alcohol in Excess of 1-2 drinks a day, Artificial Sweeteners, Coffee Creamer, and "Sugar-free" Products. This will help patient to have stable [name] glucose profile and potentially avoid unintended weight gain.   - I encouraged the patient to switch to unprocessed or minimally processed complex starch and increased protein intake (animal or plant source), fruits, and vegetables.   - Patient is advised to stick to a routine mealtimes to eat 3 meals a day and avoid unnecessary snacks (to snack only to correct hypoglycemia).  She is not currently on any medications for her diabetes.  She is not a good candidate for Metformin given her CKD and has history of unpleasant side effects from it previously.  I encouraged her to buckle down with her diet and increase her exercise when able to help prevent her from needing to start medication to control her glucose.  I encouraged her to monitor glucose at least once daily and to reach out if glucose is below 70 or above 200 for 3 tests in a row.      I spent  38  minutes in the care of the patient today including review of labs from CMP, Lipids, Thyroid Function, Hematology (current and previous including abstractions from other facilities); face-to-face time  discussing  her [name] glucose readings/logs, discussing hypoglycemia and hyperglycemia episodes and symptoms, medications doses, her options of short and long term treatment based on the latest standards of care / guidelines;  discussion about incorporating lifestyle medicine;  and documenting the encounter. Risk reduction counseling performed per USPSTF guidelines to reduce obesity and cardiovascular risk factors.     Please refer to Patient Instructions for [name] Glucose Monitoring and Insulin/Medications Dosing Guide"  in media tab for additional information. Please  also refer to " Patient Self Inventory" in the Media  tab for reviewed elements of pertinent patient history.  Brandi [name] participated in the discussions, expressed understanding, and voiced agreement with the above plans.  All questions were answered to her satisfaction. she is encouraged to contact clinic should she have any questions or concerns prior to her return visit.    FOLLOW UP PLAN:  Return in about 2 months (around 05/25/2023) for Diabetes F/U with A1c in office, Thyroid follow up, Previsit labs, Bring meter and logs.  Ronny Bacon, [facility] [address] [place], [place] [address] Phone: [phone] Fax: [phone]  03/25/2023, 8:52 AM

## 2023-03-25 NOTE — Patient Instructions (Signed)

## 2023-03-27 ENCOUNTER — Other Ambulatory Visit: Payer: Self-pay | Admitting: Nurse Practitioner

## 2023-04-13 LAB — LIPID PANEL
LDL Cholesterol: 64
Triglycerides: 156 (ref 40–160)

## 2023-04-13 LAB — MICROALBUMIN / CREATININE URINE RATIO: Microalb Creat Ratio: 21.5

## 2023-04-13 LAB — HEMOGLOBIN A1C: Hemoglobin A1C: 9.7

## 2023-04-13 LAB — BASIC METABOLIC PANEL
BUN: 18 (ref 4–21)
Creatinine: 1.2 — AB (ref 0.5–1.1)
Glucose: 269

## 2023-04-13 LAB — PROTEIN / CREATININE RATIO, URINE: Creatinine, Urine: 363

## 2023-04-13 LAB — MICROALBUMIN, URINE: Microalb, Ur: 7.8

## 2023-05-05 ENCOUNTER — Telehealth: Payer: Self-pay | Admitting: Nurse Practitioner

## 2023-05-05 ENCOUNTER — Other Ambulatory Visit: Payer: Self-pay | Admitting: "Endocrinology

## 2023-05-05 NOTE — Telephone Encounter (Signed)
Left a message requesting pt to return call to the office 

## 2023-05-05 NOTE — Telephone Encounter (Signed)
Pt is not taking any medication for diabetes at this time.

## 2023-05-05 NOTE — Telephone Encounter (Signed)
Pt called with high BG readings.   Date Before breakfast Before lunch Before supper Bedtime  05/03/2023 218     05/04/23 204     05/05/23 249             Pt taking:   Pt only checks sugar once a day and says its running high every morning.

## 2023-05-06 NOTE — Telephone Encounter (Signed)
Spoke with pt, she stated she had taken glyburide 4mg  in the past and had some at home. States she started taking the glyburide 4mg  twice daily yesterday and this morning her BG was 170.

## 2023-05-06 NOTE — Telephone Encounter (Signed)
Pt left message for you to give her a call back

## 2023-05-06 NOTE — Telephone Encounter (Signed)
Left a message requesting pt return call to the office. 

## 2023-05-06 NOTE — Telephone Encounter (Signed)
Spoke with pt, advised her to take glyburide 2mg  bid per Dr.Nida's orders. Pt voiced understanding.

## 2023-05-20 LAB — COMPREHENSIVE METABOLIC PANEL: eGFR: 48

## 2023-05-20 LAB — TSH: TSH: 0.61 (ref 0.41–5.90)

## 2023-05-20 LAB — BASIC METABOLIC PANEL
BUN: 20 (ref 4–21)
Creatinine: 1.2 — AB (ref 0.5–1.1)
Glucose: 189

## 2023-05-25 ENCOUNTER — Encounter: Payer: Self-pay | Admitting: Nurse Practitioner

## 2023-05-25 ENCOUNTER — Ambulatory Visit: Payer: Medicare HMO | Admitting: Nurse Practitioner

## 2023-05-25 VITALS — BP 123/67 | HR 52 | Ht 60.25 in | Wt 165.0 lb

## 2023-05-25 DIAGNOSIS — E890 Postprocedural hypothyroidism: Secondary | ICD-10-CM | POA: Diagnosis not present

## 2023-05-25 DIAGNOSIS — E1122 Type 2 diabetes mellitus with diabetic chronic kidney disease: Secondary | ICD-10-CM

## 2023-05-25 DIAGNOSIS — I10 Essential (primary) hypertension: Secondary | ICD-10-CM | POA: Diagnosis not present

## 2023-05-25 DIAGNOSIS — E782 Mixed hyperlipidemia: Secondary | ICD-10-CM

## 2023-05-25 DIAGNOSIS — E559 Vitamin D deficiency, unspecified: Secondary | ICD-10-CM | POA: Diagnosis not present

## 2023-05-25 DIAGNOSIS — N1831 Chronic kidney disease, stage 3a: Secondary | ICD-10-CM

## 2023-05-25 MED ORDER — LEVOTHYROXINE SODIUM 88 MCG PO TABS
88.0000 ug | ORAL_TABLET | Freq: Every day | ORAL | 0 refills | Status: DC
Start: 2023-05-25 — End: 2023-08-31

## 2023-05-25 MED ORDER — ONETOUCH VERIO VI STRP: ORAL_STRIP | 2 refills | Status: DC

## 2023-05-25 NOTE — Progress Notes (Signed)
Endocrinology Follow Up Note                                         05/25/2023, 11:44 AM  Subjective:   Subjective    Brandi [name] is a 77 y.o.-year-old female patient being seen in follow up after being seen in consultation for abnormal thyroid function referred by Brandi Blade E.  She would also like to be seen here for her diabetes management as well.   Past Medical History:  Diagnosis Date   Cancer (HCC)    Diabetes mellitus (HCC)    Hyperlipidemia    Hypertension    Thyroid disease     Past Surgical History:  Procedure Laterality Date   CYST EXCISION     1990    Social History   Socioeconomic History   Marital status: Married    Spouse name: Not on file   Number of children: Not on file   Years of education: Not on file   Highest education level: Not on file  Occupational History   Not on file  Tobacco Use   Smoking status: Never    Passive exposure: Past   Smokeless tobacco: Never  Vaping Use   Vaping status: Never Used  Substance and Sexual Activity   Alcohol use: Yes   Drug use: Never   Sexual activity: Not on file  Other Topics Concern   Not on file  Social History Narrative   Not on file   Social Determinants of Health   Financial Resource Strain: Not on file  Food Insecurity: No Food Insecurity (11/15/2021)   Received from [facility]   Hunger Vital Sign    Worried About Running Out of Food in the Last Year: Never true    Ran Out of Food in the Last Year: Never true  Transportation Needs: Not on file  Physical Activity: Not on file  Stress: Not on file  Social Connections: Unknown (03/16/2022)   Received from [facility]   Social Network    Social Network: Not on file    Family History  Problem Relation Age of Onset   Hypertension Mother    Diabetes Mother    Hyperlipidemia Mother    Heart attack Mother    Hypertension Father    Stroke Father      Outpatient Encounter Medications as of 05/25/2023  Medication Sig   acetaminophen (TYLENOL) 500 MG tablet Take by mouth.   amLODipine (NORVASC) 5 MG tablet Take 5 mg by mouth daily.   atorvastatin (LIPITOR) 20 MG tablet Take 20 mg by mouth daily.   Cholecalciferol (VITAMIN D) 50 MCG (2000 UT) tablet Take 2,000 Units by mouth daily.   citalopram (CELEXA) 10 MG tablet Take 10 mg by mouth daily.   glimepiride (AMARYL) 2 MG tablet Take 2 mg by mouth 2 (two) times daily.   losartan (COZAAR) 25 MG tablet Take 1 tablet (25 mg total) by mouth daily.   warfarin (COUMADIN) 5 MG tablet Take by mouth.  Takes 2 1/2 every day except on Wednesdays and Sundays only takes 5mg    [DISCONTINUED] levothyroxine (SYNTHROID) 75 MCG tablet Take 1 tablet (75 mcg total) by mouth daily before breakfast.   [DISCONTINUED] ONETOUCH VERIO test strip USE TO TEST [name] GLUCOSE 3 TIMES DAILY   glucose [name] (ONETOUCH VERIO) test strip Use to test [name] glucose 3 times daily.   levothyroxine (SYNTHROID) 88 MCG tablet Take 1 tablet (88 mcg total) by mouth daily before breakfast.   [DISCONTINUED] propranolol (INDERAL) 20 MG tablet TAKE 1 TABLET BY MOUTH TWICE A DAY (Patient not taking: Reported on 05/25/2023)   No facility-administered encounter medications on file as of 05/25/2023.    ALLERGIES: Allergies  Allergen Reactions   Penicillins Swelling   Sufentanil Swelling   Sulfa Antibiotics Rash and Swelling   VACCINATION STATUS: Immunization History  Administered Date(s) Administered   Moderna Sars-Covid-2 Vaccination 01/13/2020, 02/10/2020, 10/10/2020     HPI  Thyroid Problem Presents for follow-up visit. Symptoms include fatigue and weight gain. Patient reports no constipation, depressed mood, hair loss, leg swelling or palpitations (improved with Propanolol). The symptoms have been stable.  Diabetes She presents for her follow-up diabetic visit. She has type 2 diabetes mellitus. Onset time: diagnosed at approx  age of 52. Her disease course has been improving. There are no hypoglycemic associated symptoms. Associated symptoms include fatigue. There are no hypoglycemic complications. Diabetic complications include nephropathy. Risk factors for coronary artery disease include diabetes mellitus. Current diabetic treatment includes oral agent (monotherapy). She is compliant with treatment most of the time. Her weight is fluctuating minimally. She is following a generally healthy diet. When asked about meal planning, she reported none. She has not had a previous visit with a dietitian. She rarely participates in exercise. Her home [name] glucose trend is decreasing steadily. Her breakfast [name] glucose range is generally 140-180 mg/dl. Her overall [name] glucose range is 180-200 mg/dl. (She presents today with her logs showing improved, yet still above target glycemic profile.  Her PCP recently checked her A1c on 6/10 and was 9.7%, essentially unchanged from previous A1c here at 9.8%.  She denies any hypoglycemia.) An ACE inhibitor/angiotensin II receptor blocker is being taken. She does not see a podiatrist.Eye exam is current.    Brandi [name]  is a patient with the above medical history. she was diagnosed with hypothyroidism at approximate age of 35 years, which required subsequent initiation of thyroid hormone replacement. she was given various doses of Levothyroxine over the years, currently on 25 (lowered between visits due to lab results) micrograms. She was taken off this medication last visit due to signs of over-replacement.  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  After long discussion over several different visits, she reluctantly moved forward with RAI ablation (was not a good candidate for Methimazole due to coumadin use for Factor V Leiden deficiency or surgery).  Her biggest concern was whether or not she would gain weight  after treatment.  She had RAI on 12/10/22.  I reviewed patient's thyroid tests:  Lab Results  Component Value Date   TSH 0.61 05/20/2023   TSH 0.01 (A) 11/12/2022   TSH 0.01 (A) 09/04/2022   TSH 0.01 (A) 09/04/2022   TSH 0.01 (A) 07/17/2022   TSH 0.01 (A) 06/16/2022   TSH <0.005 (L) 04/17/2022   TSH <0.005 (L) 12/16/2021   TSH 0.01 (A) 09/11/2021   FREET4 3.22 (H) 04/17/2022   FREET4  2.91 (H) 12/16/2021    Pt denies feeling nodules in neck, hoarseness, dysphagia/odynophagia, SOB with lying down.  she does have family history of thyroid disorders in her sister (hypothyroidism).  No family history of thyroid cancer.   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   Review of systems  Constitutional: + Minimally fluctuating body weight,  current Body mass index is 31.96 kg/m. , + profound fatigue, no subjective hyperthermia, no subjective hypothermia Eyes: no blurry vision, no xerophthalmia ENT: no sore throat, no nodules palpated in throat, no dysphagia/odynophagia, no hoarseness Cardiovascular: no chest pain, no shortness of breath, no palpitations, no leg swelling Respiratory: no cough, no shortness of breath Gastrointestinal: no nausea/vomiting/diarrhea Musculoskeletal: no muscle/joint aches Skin: no rashes, no hyperemia Neurological: no tremors, no numbness, no tingling, no dizziness Psychiatric: no depression, no anxiety   Objective:   Objective     BP 123/67 (BP Location: Left Arm, Patient Position: Sitting, Cuff Size: Large)   Pulse (!) 52 Comment: retake apical pulse  Ht 5' 0.25" (1.53 m)   Wt 165 lb (74.8 kg)   BMI 31.96 kg/m  Wt Readings from Last 3 Encounters:  05/25/23 165 lb (74.8 kg)  03/25/23 164 lb (74.4 kg)  01/29/23 156 lb 3.2 oz (70.9 kg)    BP Readings from Last 3 Encounters:  05/25/23 123/67  03/25/23 125/84  01/29/23 117/82      Physical Exam- Limited  Constitutional:  Body  mass index is 31.96 kg/m. , not in acute distress, normal state of mind Eyes:  EOMI, no exophthalmos Musculoskeletal: no gross deformities, strength intact in all four extremities, no gross restriction of joint movements Skin:  no rashes, no hyperemia Neurological: no tremor with outstretched hands    CMP ( most recent) CMP     Component Value Date/Time   NA 139 12/02/2021 0000   K 4.3 12/02/2021 0000   CL 106 12/02/2021 0000   CO2 25 (A) 12/02/2021 0000   BUN 20 05/20/2023 0000   CREATININE 1.2 (A) 05/20/2023 0000   CREATININE 0.89 02/16/2009 0905   CALCIUM 9.4 12/02/2021 0000   GFRNONAA >60 02/16/2009 0905   GFRAA  02/16/2009 0905    >60        The eGFR has been calculated using the MDRD equation. This calculation has not been validated in all clinical situations. eGFR's persistently <60 mL/min signify possible Chronic Kidney Disease.     Diabetic Labs (most recent): Lab Results  Component Value Date   HGBA1C 9.7 04/13/2023   HGBA1C 9.8 (A) 03/25/2023   HGBA1C 8.9 (A) 11/19/2022   MICROALBUR 7.8 04/13/2023   MICROALBUR 80 mg/L 03/25/2023   MICROALBUR 30 01/21/2022     Lipid Panel ( most recent) Lipid Panel     Component Value Date/Time   CHOL 131 12/02/2021 0000   TRIG 156 04/13/2023 0000   HDL 37 12/02/2021 0000   LDLCALC 64 04/13/2023 0000       Lab Results  Component Value Date   TSH 0.61 05/20/2023   TSH 0.01 (A) 11/12/2022   TSH 0.01 (A) 09/04/2022   TSH 0.01 (A) 09/04/2022   TSH 0.01 (A) 07/17/2022   TSH 0.01 (A) 06/16/2022   TSH <0.005 (L) 04/17/2022   TSH <0.005 (L) 12/16/2021   TSH 0.01 (A) 09/11/2021   FREET4 3.22 (H) 04/17/2022   FREET4 2.91 (H) 12/16/2021     Uptake and Scan from 10/08/22 CLINICAL  DATA:  Hyperthyroidism, anxiety, weight loss, tremors, palpitations, increased tiredness, brittle nails, suppressed TSH   EXAM: THYROID SCAN AND UPTAKE - 4 AND 24 HOURS   TECHNIQUE: Following oral administration of I-123 capsule,  anterior planar imaging was acquired at 24 hours. Thyroid uptake was calculated with a thyroid probe at 4-6 hours and 24 hours.   RADIOPHARMACEUTICALS:  427 uCi I-123 sodium iodide p.o.   COMPARISON:  None Available.   FINDINGS: Homogeneous tracer distribution in both thyroid lobes.   No focal areas of increased or decreased tracer localization.   4 hour I-123 uptake = 17.8% (normal 5-20%)   24 hour I-123 uptake = 40.4% (normal 10-30%)   IMPRESSION: Homogeneous tracer uptake within both thyroid lobes with elevated 24 hour radio iodine uptake of 40.4%.   Findings consistent with Graves disease.     Electronically Signed   By: Ulyses Southward M.D.   On: 10/08/2022 13:36           Assessment & Plan:   ASSESSMENT / PLAN:  1) Hyperthyroidism-r/t Graves disease  She had RAI ablation on 12/10/22, immediately following the treatment, she did have significant worsening of symptoms and was treated with short course of oral steroids as well as Propanolol.   The symptoms she experienced were rapid weight loss, decreased appetite, debilitating fatigue, dry mouth, and trouble swallowing.  She notes she ended up quitting her job as the symptoms were so bad.  These have all significantly improved as time has gone on.  Her previsit thyroid function tests are consistent with under-replacement.  She is advised to increase her Levothyroxine to 88 mcg po daily before breakfast.  She will repeat thyroid labs in 8 weeks and call the office for further dosage instructions.   - The correct intake of thyroid hormone (Levothyroxine, Synthroid), is on empty stomach first thing in the morning, with water, separated by at least 30 minutes from breakfast and other medications,  and separated by more than 4 hours from calcium, iron, multivitamins, acid reflux medications (PPIs).  - This medication is a life-long medication and will be needed to correct thyroid hormone imbalances for the rest of your life.   The dose may change from time to time, based on thyroid [name] work.  - It is extremely important to be consistent taking this medication, near the same time each morning.  -AVOID TAKING PRODUCTS CONTAINING BIOTIN (commonly found in Hair, Skin, Nails vitamins) AS IT INTERFERES WITH THE VALIDITY OF THYROID FUNCTION [name] TESTS.  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  This is most likely due to her intermittent steroid usage for thyroid problems.  She started taking Glimepiride 2 mg po twice daily and her glucose has responded well. She is advised to continue.  - Nutritional counseling repeated at each appointment due to patients tendency to fall back in to old habits.  - The patient admits there is a room for improvement in their diet and drink choices. -  Suggestion is made for the patient to avoid simple carbohydrates from their diet including Cakes, Sweet Desserts / Pastries, Ice Cream, Soda (diet and regular), Sweet Tea, Candies, Chips, Cookies, Sweet Pastries, Store Bought Juices, Alcohol in Excess of 1-2 drinks a day, Artificial Sweeteners, Coffee Creamer, and "Sugar-free" Products. This will help patient to have stable [name] glucose profile and potentially avoid unintended weight gain.   -  I encouraged the patient to switch to unprocessed or minimally processed complex starch and increased protein intake (animal or plant source), fruits, and vegetables.   - Patient is advised to stick to a routine mealtimes to eat 3 meals a day and avoid unnecessary snacks (to snack only to correct hypoglycemia).    I encouraged her to monitor glucose at twice daily, before breakfast and before bed, and to reach out if glucose is below 70 or above 200 for 3 tests in a row.      I spent  43  minutes in the care of the patient today including review of labs from CMP, Lipids,  Thyroid Function, Hematology (current and previous including abstractions from other facilities); face-to-face time discussing  her [name] glucose readings/logs, discussing hypoglycemia and hyperglycemia episodes and symptoms, medications doses, her options of short and long term treatment based on the latest standards of care / guidelines;  discussion about incorporating lifestyle medicine;  and documenting the encounter. Risk reduction counseling performed per USPSTF guidelines to reduce obesity and cardiovascular risk factors.     Please refer to Patient Instructions for [name] Glucose Monitoring and Insulin/Medications Dosing Guide"  in media tab for additional information. Please  also refer to " Patient Self Inventory" in the Media  tab for reviewed elements of pertinent patient history.  Brandi [name] participated in the discussions, expressed understanding, and voiced agreement with the above plans.  All questions were answered to her satisfaction. she is encouraged to contact clinic should she have any questions or concerns prior to her return visit.    FOLLOW UP PLAN:  Return in about 3 months (around 08/25/2023) for Diabetes F/U with A1c in office, Bring meter and logs, Thyroid follow up with labs in 8 weeks.  Ronny Bacon, [facility] [address], [place] [address] Phone: [phone] Fax: [phone]  05/25/2023, 11:44 AM

## 2023-05-25 NOTE — Patient Instructions (Signed)

## 2023-06-26 ENCOUNTER — Other Ambulatory Visit: Payer: Self-pay | Admitting: Nurse Practitioner

## 2023-07-30 ENCOUNTER — Telehealth: Payer: Self-pay | Admitting: Nurse Practitioner

## 2023-07-30 NOTE — Telephone Encounter (Signed)
Pt said to please call her tomorrow if we do not receive her lab results by then

## 2023-07-31 NOTE — Telephone Encounter (Signed)
Tried to call pt to let her know we do not have her labs. No answer no vm

## 2023-08-31 ENCOUNTER — Ambulatory Visit: Payer: Medicare HMO | Admitting: Nurse Practitioner

## 2023-08-31 ENCOUNTER — Encounter: Payer: Self-pay | Admitting: Nurse Practitioner

## 2023-08-31 VITALS — BP 124/67 | HR 53 | Ht 60.25 in | Wt 174.4 lb

## 2023-08-31 DIAGNOSIS — E782 Mixed hyperlipidemia: Secondary | ICD-10-CM

## 2023-08-31 DIAGNOSIS — E890 Postprocedural hypothyroidism: Secondary | ICD-10-CM

## 2023-08-31 DIAGNOSIS — N1831 Chronic kidney disease, stage 3a: Secondary | ICD-10-CM

## 2023-08-31 DIAGNOSIS — Z7984 Long term (current) use of oral hypoglycemic drugs: Secondary | ICD-10-CM

## 2023-08-31 DIAGNOSIS — I10 Essential (primary) hypertension: Secondary | ICD-10-CM

## 2023-08-31 DIAGNOSIS — E1122 Type 2 diabetes mellitus with diabetic chronic kidney disease: Secondary | ICD-10-CM | POA: Diagnosis not present

## 2023-08-31 DIAGNOSIS — E559 Vitamin D deficiency, unspecified: Secondary | ICD-10-CM

## 2023-08-31 DIAGNOSIS — R5382 Chronic fatigue, unspecified: Secondary | ICD-10-CM

## 2023-08-31 LAB — POCT GLYCOSYLATED HEMOGLOBIN (HGB A1C): Hemoglobin A1C: 7.2 % — AB (ref 4.0–5.6)

## 2023-08-31 MED ORDER — GLIMEPIRIDE 2 MG PO TABS: 2.0000 mg | ORAL_TABLET | Freq: Two times a day (BID) | ORAL | 3 refills | Status: DC

## 2023-08-31 MED ORDER — LEVOTHYROXINE SODIUM 88 MCG PO TABS
88.0000 ug | ORAL_TABLET | Freq: Every day | ORAL | 3 refills | Status: DC
Start: 2023-08-31 — End: 2024-03-03

## 2023-08-31 NOTE — Progress Notes (Signed)
Endocrinology Follow Up Note                                         08/31/2023, 12:38 PM  Subjective:   Subjective    Brandi [name] is a 77 y.o.-year-old female patient being seen in follow up after being seen in consultation for abnormal thyroid function referred by Richarda Blade E.  She would also like to be seen here for her diabetes management as well.   Past Medical History:  Diagnosis Date   Cancer (HCC)    Diabetes mellitus (HCC)    Hyperlipidemia    Hypertension    Thyroid disease     Past Surgical History:  Procedure Laterality Date   CYST EXCISION     1990    Social History   Socioeconomic History   Marital status: Married    Spouse name: Not on file   Number of children: Not on file   Years of education: Not on file   Highest education level: Not on file  Occupational History   Not on file  Tobacco Use   Smoking status: Never    Passive exposure: Past   Smokeless tobacco: Never  Vaping Use   Vaping status: Never Used  Substance and Sexual Activity   Alcohol use: Yes   Drug use: Never   Sexual activity: Not on file  Other Topics Concern   Not on file  Social History Narrative   Not on file   Social Determinants of Health   Financial Resource Strain: Not on file  Food Insecurity: No Food Insecurity (11/15/2021)   Received from [facility], [facility]   Hunger Vital Sign    Worried About Running Out of Food in the Last Year: Never true    Ran Out of Food in the Last Year: Never true  Transportation Needs: Not on file  Physical Activity: Not on file  Stress: Not on file  Social Connections: Unknown (03/16/2022)   Received from [facility], [facility]   Social Network    Social Network: Not on file    Family History  Problem Relation Age of Onset   Hypertension Mother    Diabetes Mother    Hyperlipidemia Mother    Heart attack Mother    Hypertension  Father    Stroke Father     Outpatient Encounter Medications as of 08/31/2023  Medication Sig   acetaminophen (TYLENOL) 500 MG tablet Take by mouth.   amLODipine (NORVASC) 5 MG tablet Take 5 mg by mouth daily.   atorvastatin (LIPITOR) 20 MG tablet Take 20 mg by mouth daily.   Cholecalciferol (VITAMIN D) 50 MCG (2000 UT) tablet Take 2,000 Units by mouth daily.   citalopram (CELEXA) 10 MG tablet Take 10 mg by mouth daily.   glucose [name] (ONETOUCH VERIO) test strip Use to test [name] glucose 3 times daily.   losartan (COZAAR) 25 MG tablet TAKE 1 TABLET (25 MG TOTAL) BY MOUTH DAILY.   warfarin (COUMADIN) 5 MG  tablet Take by mouth. Takes 2 1/2 every day except on Wednesdays and Sundays only takes 5mg    [DISCONTINUED] glimepiride (AMARYL) 2 MG tablet Take 2 mg by mouth 2 (two) times daily.   [DISCONTINUED] levothyroxine (SYNTHROID) 88 MCG tablet Take 1 tablet (88 mcg total) by mouth daily before breakfast.   glimepiride (AMARYL) 2 MG tablet Take 1 tablet (2 mg total) by mouth 2 (two) times daily.   levothyroxine (SYNTHROID) 88 MCG tablet Take 1 tablet (88 mcg total) by mouth daily before breakfast.   No facility-administered encounter medications on file as of 08/31/2023.    ALLERGIES: Allergies  Allergen Reactions   Penicillins Swelling   Sufentanil Swelling   Sulfa Antibiotics Rash and Swelling   VACCINATION STATUS: Immunization History  Administered Date(s) Administered   Moderna Sars-Covid-2 Vaccination 01/13/2020, 02/10/2020, 10/10/2020     HPI  Thyroid Problem Presents for follow-up visit. Symptoms include fatigue and weight gain. Patient reports no constipation, depressed mood, hair loss, leg swelling or palpitations (improved with Propanolol). The symptoms have been stable.  Diabetes She presents for her follow-up diabetic visit. She has type 2 diabetes mellitus. Onset time: diagnosed at approx age of 65. Her disease course has been improving. There are no hypoglycemic  associated symptoms. Associated symptoms include fatigue. There are no hypoglycemic complications. Diabetic complications include nephropathy. Risk factors for coronary artery disease include diabetes mellitus. Current diabetic treatment includes oral agent (monotherapy). She is compliant with treatment most of the time. Her weight is increasing steadily. She is following a generally healthy diet. When asked about meal planning, she reported none. She has not had a previous visit with a dietitian. She rarely participates in exercise. Her home [name] glucose trend is decreasing steadily. Her breakfast [name] glucose range is generally 140-180 mg/dl. Her overall [name] glucose range is 140-180 mg/dl. (She presents today with no logs or meter (forgot them at home).  She notes fasting readings are between 120-150 and evening readings are 170-190 mostly.  Her POCT A1c today is 7.2%, improving from last visit of 9.7%.  She denies any hypoglycemia.) An ACE inhibitor/angiotensin II receptor blocker is being taken. She does not see a podiatrist.Eye exam is current.    Brandi [name]  is a patient with the above medical history. she was diagnosed with hypothyroidism at approximate age of 59 years, which required subsequent initiation of thyroid hormone replacement. she was given various doses of Levothyroxine over the years, currently on 25 (lowered between visits due to lab results) micrograms. She was taken off this medication last visit due to signs of over-replacement.  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  After long discussion over several different visits, she reluctantly moved forward with RAI ablation (was not a good candidate for Methimazole due to coumadin use for Factor V Leiden deficiency or surgery).  Her biggest concern was whether or not she would gain weight after treatment.  She had RAI on 12/10/22.  I reviewed patient's  thyroid tests:  Lab Results  Component Value Date   TSH 0.61 05/20/2023   TSH 0.01 (A) 11/12/2022   TSH 0.01 (A) 09/04/2022   TSH 0.01 (A) 09/04/2022   TSH 0.01 (A) 07/17/2022   TSH 0.01 (A) 06/16/2022   TSH <0.005 (L) 04/17/2022   TSH <0.005 (L) 12/16/2021   TSH 0.01 (A) 09/11/2021   FREET4 3.22 (H) 04/17/2022   FREET4 2.91 (H) 12/16/2021    Pt denies  feeling nodules in neck, hoarseness, dysphagia/odynophagia, SOB with lying down.  she does have family history of thyroid disorders in her sister (hypothyroidism).  No family history of thyroid cancer.   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   Review of systems  Constitutional: + increasing body weight (has regained some weight from where she previously lost due to hyperthyroidism),  current Body mass index is 33.78 kg/m. , + profound fatigue, no subjective hyperthermia, no subjective hypothermia Eyes: no blurry vision, no xerophthalmia ENT: no sore throat, no nodules palpated in throat, no dysphagia/odynophagia, no hoarseness Cardiovascular: no chest pain, no shortness of breath, no palpitations, no leg swelling Respiratory: no cough, no shortness of breath Gastrointestinal: no nausea/vomiting/diarrhea Musculoskeletal: no muscle/joint aches Skin: no rashes, no hyperemia Neurological: no tremors, no numbness, no tingling, no dizziness Psychiatric: no depression, no anxiety   Objective:   Objective     BP 124/67 (BP Location: Left Arm, Patient Position: Sitting, Cuff Size: Large)   Pulse (!) 53   Ht 5' 0.25" (1.53 m)   Wt 174 lb 6.4 oz (79.1 kg)   BMI 33.78 kg/m  Wt Readings from Last 3 Encounters:  08/31/23 174 lb 6.4 oz (79.1 kg)  05/25/23 165 lb (74.8 kg)  03/25/23 164 lb (74.4 kg)    BP Readings from Last 3 Encounters:  08/31/23 124/67  05/25/23 123/67  03/25/23 125/84      Physical Exam- Limited  Constitutional:  Body mass index is  33.78 kg/m. , not in acute distress, normal state of mind Eyes:  EOMI, no exophthalmos Musculoskeletal: no gross deformities, strength intact in all four extremities, no gross restriction of joint movements Skin:  no rashes, no hyperemia Neurological: no tremor with outstretched hands    CMP ( most recent) CMP     Component Value Date/Time   NA 139 12/02/2021 0000   K 4.3 12/02/2021 0000   CL 106 12/02/2021 0000   CO2 25 (A) 12/02/2021 0000   BUN 20 05/20/2023 0000   CREATININE 1.2 (A) 05/20/2023 0000   CREATININE 0.89 02/16/2009 0905   CALCIUM 9.4 12/02/2021 0000   GFRNONAA >60 02/16/2009 0905   GFRAA  02/16/2009 0905    >60        The eGFR has been calculated using the MDRD equation. This calculation has not been validated in all clinical situations. eGFR's persistently <60 mL/min signify possible Chronic Kidney Disease.     Diabetic Labs (most recent): Lab Results  Component Value Date   HGBA1C 7.2 (A) 08/31/2023   HGBA1C 9.7 04/13/2023   HGBA1C 9.8 (A) 03/25/2023   MICROALBUR 7.8 04/13/2023   MICROALBUR 80 mg/L 03/25/2023   MICROALBUR 30 01/21/2022     Lipid Panel ( most recent) Lipid Panel     Component Value Date/Time   CHOL 131 12/02/2021 0000   TRIG 156 04/13/2023 0000   HDL 37 12/02/2021 0000   LDLCALC 64 04/13/2023 0000       Lab Results  Component Value Date   TSH 0.61 05/20/2023   TSH 0.01 (A) 11/12/2022   TSH 0.01 (A) 09/04/2022   TSH 0.01 (A) 09/04/2022   TSH 0.01 (A) 07/17/2022   TSH 0.01 (A) 06/16/2022   TSH <0.005 (L) 04/17/2022   TSH <0.005 (L) 12/16/2021   TSH 0.01 (A) 09/11/2021   FREET4 3.22 (H) 04/17/2022   FREET4 2.91 (H) 12/16/2021     Uptake and Scan from  10/08/22 CLINICAL DATA:  Hyperthyroidism, anxiety, weight loss, tremors, palpitations, increased tiredness, brittle nails, suppressed TSH   EXAM: THYROID SCAN AND UPTAKE - 4 AND 24 HOURS   TECHNIQUE: Following oral administration of I-123 capsule, anterior  planar imaging was acquired at 24 hours. Thyroid uptake was calculated with a thyroid probe at 4-6 hours and 24 hours.   RADIOPHARMACEUTICALS:  427 uCi I-123 sodium iodide p.o.   COMPARISON:  None Available.   FINDINGS: Homogeneous tracer distribution in both thyroid lobes.   No focal areas of increased or decreased tracer localization.   4 hour I-123 uptake = 17.8% (normal 5-20%)   24 hour I-123 uptake = 40.4% (normal 10-30%)   IMPRESSION: Homogeneous tracer uptake within both thyroid lobes with elevated 24 hour radio iodine uptake of 40.4%.   Findings consistent with Graves disease.     Electronically Signed   By: Ulyses Southward M.D.   On: 10/08/2022 13:36           Assessment & Plan:   ASSESSMENT / PLAN:  1) Hyperthyroidism-r/t Graves disease  She had RAI ablation on 12/10/22, immediately following the treatment, she did have significant worsening of symptoms and was treated with short course of oral steroids as well as Propanolol.   The symptoms she experienced were rapid weight loss, decreased appetite, debilitating fatigue, dry mouth, and trouble swallowing.  She notes she ended up quitting her job as the symptoms were so bad.  These have all significantly improved as time has gone on.  Her previsit thyroid function tests are consistent with appropriate hormone replacement.  She is advised to continue her Levothyroxine 88 mcg po daily before breakfast.  Will recheck TFTs in 3 months and assess for further dosage adjustment needs.   - The correct intake of thyroid hormone (Levothyroxine, Synthroid), is on empty stomach first thing in the morning, with water, separated by at least 30 minutes from breakfast and other medications,  and separated by more than 4 hours from calcium, iron, multivitamins, acid reflux medications (PPIs).  - This medication is a life-long medication and will be needed to correct thyroid hormone imbalances for the rest of your life.  The dose  may change from time to time, based on thyroid [name] work.  - It is extremely important to be consistent taking this medication, near the same time each morning.  -AVOID TAKING PRODUCTS CONTAINING BIOTIN (commonly found in Hair, Skin, Nails vitamins) AS IT INTERFERES WITH THE VALIDITY OF THYROID FUNCTION [name] TESTS.  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  She notes fasting readings are between 120-150 and evening readings are 170-190 mostly.  Her POCT A1c today is 7.2%, improving from last visit of 9.7%.  She denies any hypoglycemia.    - Nutritional counseling repeated at each appointment due to patients tendency to fall back in to old habits.  - The patient admits there is a room for improvement in their diet and drink choices. -  Suggestion is made for the patient to avoid simple carbohydrates from their diet including Cakes, Sweet Desserts / Pastries, Ice Cream, Soda (diet and regular), Sweet Tea, Candies, Chips, Cookies, Sweet Pastries, Store Bought Juices, Alcohol in Excess of 1-2 drinks a day, Artificial Sweeteners, Coffee Creamer, and "Sugar-free" Products. This will help patient to have stable [name] glucose profile and potentially avoid unintended weight gain.   -  I encouraged the patient to switch to unprocessed or minimally processed complex starch and increased protein intake (animal or plant source), fruits, and vegetables.   - Patient is advised to stick to a routine mealtimes to eat 3 meals a day and avoid unnecessary snacks (to snack only to correct hypoglycemia).  -she can continue Glimepiride 2 mg po twice daily.  May consider changing to a GLP1 in the future.  I encouraged her to monitor glucose at twice daily, before breakfast and before bed, and to reach out if glucose is below 70 or above 200 for 3 tests in a row.  Will check for other  possible etiologies for her chronic, profound fatigue.  Will check B12, iron studies, vitamin D prior to next visit.  She notes her HR is in the 50's which may be a contributing factor.  We discussed the potential need to send to cardiology.     I spent  30  minutes in the care of the patient today including review of labs from CMP, Lipids, Thyroid Function, Hematology (current and previous including abstractions from other facilities); face-to-face time discussing  her [name] glucose readings/logs, discussing hypoglycemia and hyperglycemia episodes and symptoms, medications doses, her options of short and long term treatment based on the latest standards of care / guidelines;  discussion about incorporating lifestyle medicine;  and documenting the encounter. Risk reduction counseling performed per USPSTF guidelines to reduce obesity and cardiovascular risk factors.     Please refer to Patient Instructions for [name] Glucose Monitoring and Insulin/Medications Dosing Guide"  in media tab for additional information. Please  also refer to " Patient Self Inventory" in the Media  tab for reviewed elements of pertinent patient history.  Brandi [name] participated in the discussions, expressed understanding, and voiced agreement with the above plans.  All questions were answered to her satisfaction. she is encouraged to contact clinic should she have any questions or concerns prior to her return visit.    FOLLOW UP PLAN:  Return in about 3 months (around 12/01/2023) for Thyroid follow up, Previsit labs, Diabetes F/U with A1c in office.  Ronny Bacon, [facility] [address] [place], [place] [address] Phone: [phone] Fax: [phone]  08/31/2023, 12:38 PM

## 2023-09-06 ENCOUNTER — Other Ambulatory Visit: Payer: Self-pay | Admitting: Nurse Practitioner

## 2023-10-22 LAB — HEMOGLOBIN A1C
EGFR: 49
Hemoglobin A1C: 7.6

## 2023-11-26 LAB — LAB REPORT - SCANNED
Free T4: 1.32 ng/dL
TSH: 1.63 (ref 0.41–5.90)

## 2023-12-03 ENCOUNTER — Other Ambulatory Visit: Payer: Self-pay | Admitting: *Deleted

## 2023-12-03 ENCOUNTER — Encounter: Payer: Self-pay | Admitting: Nurse Practitioner

## 2023-12-03 ENCOUNTER — Ambulatory Visit: Payer: Medicare HMO | Admitting: Nurse Practitioner

## 2023-12-03 VITALS — BP 96/60 | HR 61 | Ht 63.0 in | Wt 179.4 lb

## 2023-12-03 DIAGNOSIS — E1122 Type 2 diabetes mellitus with diabetic chronic kidney disease: Secondary | ICD-10-CM | POA: Diagnosis not present

## 2023-12-03 DIAGNOSIS — I10 Essential (primary) hypertension: Secondary | ICD-10-CM

## 2023-12-03 DIAGNOSIS — E782 Mixed hyperlipidemia: Secondary | ICD-10-CM

## 2023-12-03 DIAGNOSIS — E559 Vitamin D deficiency, unspecified: Secondary | ICD-10-CM

## 2023-12-03 DIAGNOSIS — N1831 Chronic kidney disease, stage 3a: Secondary | ICD-10-CM

## 2023-12-03 DIAGNOSIS — Z7985 Long-term (current) use of injectable non-insulin antidiabetic drugs: Secondary | ICD-10-CM

## 2023-12-03 DIAGNOSIS — E890 Postprocedural hypothyroidism: Secondary | ICD-10-CM

## 2023-12-03 MED ORDER — TIRZEPATIDE 5 MG/0.5ML ~~LOC~~ SOAJ: 5.0000 mg | SUBCUTANEOUS | 1 refills | Status: DC

## 2023-12-03 NOTE — Progress Notes (Signed)
Endocrinology Follow Up Note                                         12/03/2023, 4:07 PM  Subjective:   Subjective    Brandi Brandi [name] is a 78 y.o.-year-old female patient being seen in follow up after being seen in consultation for abnormal thyroid function referred by Brandi Brandi [name].  She would also like to be seen here for her diabetes management as well.   Past Medical History:  Diagnosis Date   Cancer (HCC)    Diabetes mellitus (HCC)    Hyperlipidemia    Hypertension    Thyroid disease     Past Surgical History:  Procedure Laterality Date   CYST EXCISION     1990    Social History   Socioeconomic History   Marital status: Married    Spouse name: Not on file   Number of children: Not on file   Years of education: Not on file   Highest education level: Not on file  Occupational History   Not on file  Tobacco Use   Smoking status: Never    Passive exposure: Past   Smokeless tobacco: Never  Vaping Use   Vaping status: Never Used  Substance and Sexual Activity   Alcohol use: Yes   Drug use: Never   Sexual activity: Not on file  Other Topics Concern   Not on file  Social History Narrative   Not on file   Social Drivers of Health   Financial Resource Strain: Not on file  Food Insecurity: No Food Insecurity (11/15/2021)   Received from [facility], [facility]   Hunger Vital Sign    Worried About Running Out of Food in the Last Year: Never true    Ran Out of Food in the Last Year: Never true  Transportation Needs: Not on file  Physical Activity: Not on file  Stress: Not on file  Social Connections: Unknown (03/16/2022)   Received from [facility], [facility]   Social Network    Social Network: Not on file    Family History  Problem Relation Age of Onset   Hypertension Mother    Diabetes Mother    Hyperlipidemia Mother    Heart attack Mother    Hypertension Father     Stroke Father     Outpatient Encounter Medications as of 12/03/2023  Medication Sig   acetaminophen (TYLENOL) 500 MG tablet Take by mouth.   amLODipine (NORVASC) 5 MG tablet Take 5 mg by mouth daily.   atorvastatin (LIPITOR) 20 MG tablet Take 20 mg by mouth daily.   Cholecalciferol (VITAMIN D) 50 MCG (2000 UT) tablet Take 2,000 Units by mouth daily.   citalopram (CELEXA) 10 MG tablet Take 10 mg by mouth daily.   glimepiride (AMARYL) 2 MG tablet Take 1 tablet (2 mg total) by mouth 2 (two) times daily.   levothyroxine (SYNTHROID) 88 MCG tablet Take 1 tablet (88 mcg total) by mouth daily before breakfast.  losartan (COZAAR) 25 MG tablet TAKE 1 TABLET (25 MG TOTAL) BY MOUTH DAILY.   ONETOUCH VERIO test strip USE TO TEST Brandi [name] GLUCOSE 3 TIMES DAILY   tirzepatide (MOUNJARO) 5 MG/0.5ML Pen Inject 5 mg into the skin once a week.   warfarin (COUMADIN) 5 MG tablet Take by mouth. Takes 2 1/2 every day except on Wednesdays and Sundays only takes 5mg    No facility-administered encounter medications on file as of 12/03/2023.    ALLERGIES: Allergies  Allergen Reactions   Penicillins Swelling   Sufentanil Swelling   Sulfa Antibiotics Rash and Swelling   VACCINATION STATUS: Immunization History  Administered Date(s) Administered   Moderna Sars-Covid-2 Vaccination 01/13/2020, 02/10/2020, 10/10/2020     HPI  Thyroid Problem Presents for follow-up visit. Symptoms include fatigue and weight gain. Patient reports no constipation, depressed mood, hair loss, leg swelling or palpitations (improved with Propanolol). The symptoms have been stable.  Diabetes She presents for her follow-up diabetic visit. She has type 2 diabetes mellitus. Onset time: diagnosed at approx age of 45. Her disease course has been worsening. There are no hypoglycemic associated symptoms. Associated symptoms include fatigue. There are no hypoglycemic complications. Diabetic complications include nephropathy. Risk factors for  coronary artery disease include diabetes mellitus. Current diabetic treatment includes oral agent (monotherapy). She is compliant with treatment most of the time. Her weight is increasing steadily. She is following a generally healthy diet. When asked about meal planning, she reported none. She has not had a previous visit with a dietitian. She rarely participates in exercise. Her home Brandi [name] glucose trend is decreasing steadily. Her breakfast Brandi [name] glucose range is generally 140-180 mg/dl. Her bedtime Brandi [name] glucose range is generally >200 mg/dl. Her overall Brandi [name] glucose range is 180-200 mg/dl. (She presents today with her logs showing slightly above target fasting and significantly above target postprandial readings.  Her most recent A1c checked by her PCP on 12/19 was 7.6%.  She notes she still skips lunch most days and does not eat anything sweet, no sugary drinks.  She denies any hypoglycemia.  She is most concerned about recent weight gain.) An ACE inhibitor/angiotensin II receptor blocker is being taken. She does not see a podiatrist.Eye exam is current.    Brandi Brandi [name]  is a patient with the above medical history. she was diagnosed with hypothyroidism at approximate age of 60 years, which required subsequent initiation of thyroid hormone replacement. she was given various doses of Levothyroxine over the years, currently on 25 (lowered between visits due to lab results) micrograms. She was taken off this medication last visit due to signs of over-replacement.  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  After long discussion over several different visits, she reluctantly moved forward with RAI ablation (was not a good candidate for Methimazole due to coumadin use for Factor V Leiden deficiency or surgery).  Her biggest concern was whether or not she would gain weight after treatment.  She had RAI on 12/10/22.  I reviewed  patient's thyroid tests:  Lab Results  Component Value Date   TSH 1.63 11/26/2023   TSH 0.61 05/20/2023   TSH 0.01 (A) 11/12/2022   TSH 0.01 (A) 09/04/2022   TSH 0.01 (A) 09/04/2022   TSH 0.01 (A) 07/17/2022   TSH 0.01 (A) 06/16/2022   TSH <0.005 (L) 04/17/2022   TSH <0.005 (L) 12/16/2021   TSH 0.01 (A) 09/11/2021   FREET4 1.32 11/26/2023   FREET4  3.22 (H) 04/17/2022   FREET4 2.91 (H) 12/16/2021    Pt denies feeling nodules in neck, hoarseness, dysphagia/odynophagia, SOB with lying down.  she does have family history of thyroid disorders in her sister (hypothyroidism).  No family history of thyroid cancer.   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   Review of systems  Constitutional: + increasing body weight,  current Body mass index is 31.78 kg/m. , +  fatigue-improving, no subjective hyperthermia, no subjective hypothermia Eyes: no blurry vision, no xerophthalmia ENT: no sore throat, no nodules palpated in throat, no dysphagia/odynophagia, no hoarseness Cardiovascular: no chest pain, no shortness of breath, no palpitations, no leg swelling Respiratory: no cough, no shortness of breath Gastrointestinal: no nausea/vomiting/diarrhea Musculoskeletal: no muscle/joint aches Skin: no rashes, no hyperemia Neurological: no tremors, no numbness, no tingling, no dizziness Psychiatric: no depression, no anxiety   Objective:   Objective     BP 96/60 (BP Location: Left Arm, Patient Position: Sitting, Cuff Size: Large)   Pulse 61   Ht 5\' 3"  (1.6 m)   Wt 179 lb 6.4 oz (81.4 kg)   BMI 31.78 kg/m  Wt Readings from Last 3 Encounters:  12/03/23 179 lb 6.4 oz (81.4 kg)  08/31/23 174 lb 6.4 oz (79.1 kg)  05/25/23 165 lb (74.8 kg)    BP Readings from Last 3 Encounters:  12/03/23 96/60  08/31/23 124/67  05/25/23 123/67      Physical Exam- Limited  Constitutional:  Body mass index is 31.78 kg/m. , not in  acute distress, normal state of mind Eyes:  EOMI, no exophthalmos Musculoskeletal: no gross deformities, strength intact in all four extremities, no gross restriction of joint movements Skin:  no rashes, no hyperemia Neurological: no tremor with outstretched hands    CMP ( most recent) CMP     Component Value Date/Time   NA 139 12/02/2021 0000   K 4.3 12/02/2021 0000   CL 106 12/02/2021 0000   CO2 25 (A) 12/02/2021 0000   BUN 20 05/20/2023 0000   CREATININE 1.2 (A) 05/20/2023 0000   CREATININE 0.89 02/16/2009 0905   CALCIUM 9.4 12/02/2021 0000   GFRNONAA >60 02/16/2009 0905   GFRAA  02/16/2009 0905    >60        The eGFR has been calculated using the MDRD equation. This calculation has not been validated in all clinical situations. eGFR's persistently <60 mL/min signify possible Chronic Kidney Disease.     Diabetic Labs (most recent): Lab Results  Component Value Date   HGBA1C 7.6 10/22/2023   HGBA1C 7.2 (A) 08/31/2023   HGBA1C 9.7 04/13/2023   MICROALBUR 7.8 04/13/2023   MICROALBUR 80 mg/L 03/25/2023   MICROALBUR 30 01/21/2022     Lipid Panel ( most recent) Lipid Panel     Component Value Date/Time   CHOL 131 12/02/2021 0000   TRIG 156 04/13/2023 0000   HDL 37 12/02/2021 0000   LDLCALC 64 04/13/2023 0000       Lab Results  Component Value Date   TSH 1.63 11/26/2023   TSH 0.61 05/20/2023   TSH 0.01 (A) 11/12/2022   TSH 0.01 (A) 09/04/2022   TSH 0.01 (A) 09/04/2022   TSH 0.01 (A) 07/17/2022   TSH 0.01 (A) 06/16/2022   TSH <0.005 (L) 04/17/2022   TSH <0.005 (L) 12/16/2021   TSH 0.01 (A) 09/11/2021   FREET4 1.32 11/26/2023   FREET4 3.22 (H) 04/17/2022  FREET4 2.91 (H) 12/16/2021     Uptake and Scan from 10/08/22 CLINICAL DATA:  Hyperthyroidism, anxiety, weight loss, tremors, palpitations, increased tiredness, brittle nails, suppressed TSH   EXAM: THYROID SCAN AND UPTAKE - 4 AND 24 HOURS   TECHNIQUE: Following oral administration of I-123  capsule, anterior planar imaging was acquired at 24 hours. Thyroid uptake was calculated with a thyroid probe at 4-6 hours and 24 hours.   RADIOPHARMACEUTICALS:  427 uCi I-123 sodium iodide p.o.   COMPARISON:  None Available.   FINDINGS: Homogeneous tracer distribution in both thyroid lobes.   No focal areas of increased or decreased tracer localization.   4 hour I-123 uptake = 17.8% (normal 5-20%)   24 hour I-123 uptake = 40.4% (normal 10-30%)   IMPRESSION: Homogeneous tracer uptake within both thyroid lobes with elevated 24 hour radio iodine uptake of 40.4%.   Findings consistent with Graves disease.     Electronically Signed   By: Ulyses Southward M.D.   On: 10/08/2022 13:36               Assessment & Plan:   ASSESSMENT / PLAN:  1) Hyperthyroidism-r/t Graves disease  She had RAI ablation on 12/10/22, immediately following the treatment, she did have significant worsening of symptoms and was treated with short course of oral steroids as well as Propanolol.   The symptoms she experienced were rapid weight loss, decreased appetite, debilitating fatigue, dry mouth, and trouble swallowing.  She notes she ended up quitting her job as the symptoms were so bad.  These have all significantly improved as time has gone on.  Her previsit thyroid function tests are consistent with appropriate hormone replacement.  She is advised to continue her Levothyroxine 88 mcg po daily before breakfast.     - The correct intake of thyroid hormone (Levothyroxine, Synthroid), is on empty stomach first thing in the morning, with water, separated by at least 30 minutes from breakfast and other medications,  and separated by more than 4 hours from calcium, iron, multivitamins, acid reflux medications (PPIs).  - This medication is a life-long medication and will be needed to correct thyroid hormone imbalances for the rest of your life.  The dose may change from time to time, based on thyroid Brandi [name]  work.  - It is extremely important to be consistent taking this medication, near the same time each morning.  -AVOID TAKING PRODUCTS CONTAINING BIOTIN (commonly found in Hair, Skin, Nails vitamins) AS IT INTERFERES WITH THE VALIDITY OF THYROID FUNCTION Brandi [name] TESTS.  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  Her most recent A1c checked by her PCP on 12/19 was 7.6%.  She notes she still skips lunch most days and does not eat anything sweet, no sugary drinks.  She denies any hypoglycemia.  She is most concerned about recent weight gain.    - Nutritional counseling repeated at each appointment due to patients tendency to fall back in to old habits.  - The patient admits there is a room for improvement in their diet and drink choices. -  Suggestion is made for the patient to avoid simple carbohydrates from their diet including Cakes, Sweet Desserts / Pastries, Ice Cream, Soda (diet and regular), Sweet Tea, Candies, Chips, Cookies, Sweet Pastries, Store Bought Juices, Alcohol in Excess of 1-2 drinks a day, Artificial Sweeteners,  Coffee Creamer, and "Sugar-free" Products. This will help patient to have stable Brandi [name] glucose profile and potentially avoid unintended weight gain.   - I encouraged the patient to switch to unprocessed or minimally processed complex starch and increased protein intake (animal or plant source), fruits, and vegetables.   - Patient is advised to stick to a routine mealtimes to eat 3 meals a day and avoid unnecessary snacks (to snack only to correct hypoglycemia).  -Given recent weight gain and uncontrolled readings, will change to GLP1.  I discussed and initiated Mounjaro 2.5 mg SQ weekly x 4 weeks, then she can advance to 5 mg SQ weekly thereafter.  I gave her sample box today and had her  self-inject with my supervision as she was nervous that she wouldn't be able to do it at home.  I instructed her to STOP the Glimepiride.  I encouraged her to monitor glucose at twice daily, before breakfast and before bed, and to reach out if glucose is below 70 or above 200 for 3 tests in a row.  Her vitamin D, iron panel, and B12 all came back normal.      I spent  46  minutes in the care of the patient today including review of labs from CMP, Lipids, Thyroid Function, Hematology (current and previous including abstractions from other facilities); face-to-face time discussing  her Brandi [name] glucose readings/logs, discussing hypoglycemia and hyperglycemia episodes and symptoms, medications doses, her options of short and long term treatment based on the latest standards of care / guidelines;  discussion about incorporating lifestyle medicine;  and documenting the encounter. Risk reduction counseling performed per USPSTF guidelines to reduce obesity and cardiovascular risk factors.     Please refer to Patient Instructions for Brandi [name] Glucose Monitoring and Insulin/Medications Dosing Guide"  in media tab for additional information. Please  also refer to " Patient Self Inventory" in the Media  tab for reviewed elements of pertinent patient history.  Brandi Brandi [name] participated in the discussions, expressed understanding, and voiced agreement with the above plans.  All questions were answered to her satisfaction. she is encouraged to contact clinic should she have any questions or concerns prior to her return visit.    FOLLOW UP PLAN:  Return in about 3 months (around 03/02/2024) for Diabetes F/U with A1c in office, No previsit labs, Bring meter and logs.  Ronny Bacon, [facility] [address] [place], [place] [address] Phone: [phone] Fax: [phone]  12/03/2023, 4:07 PM

## 2023-12-23 ENCOUNTER — Other Ambulatory Visit: Payer: Self-pay | Admitting: Nurse Practitioner

## 2024-03-03 ENCOUNTER — Ambulatory Visit: Payer: Medicare HMO | Admitting: Nurse Practitioner

## 2024-03-03 ENCOUNTER — Encounter: Payer: Self-pay | Admitting: Nurse Practitioner

## 2024-03-03 VITALS — BP 110/68 | HR 88 | Ht 63.0 in | Wt 171.8 lb

## 2024-03-03 DIAGNOSIS — E559 Vitamin D deficiency, unspecified: Secondary | ICD-10-CM

## 2024-03-03 DIAGNOSIS — N1831 Chronic kidney disease, stage 3a: Secondary | ICD-10-CM | POA: Diagnosis not present

## 2024-03-03 DIAGNOSIS — Z7985 Long-term (current) use of injectable non-insulin antidiabetic drugs: Secondary | ICD-10-CM | POA: Diagnosis not present

## 2024-03-03 DIAGNOSIS — E1122 Type 2 diabetes mellitus with diabetic chronic kidney disease: Secondary | ICD-10-CM

## 2024-03-03 DIAGNOSIS — E890 Postprocedural hypothyroidism: Secondary | ICD-10-CM

## 2024-03-03 DIAGNOSIS — I10 Essential (primary) hypertension: Secondary | ICD-10-CM | POA: Diagnosis not present

## 2024-03-03 DIAGNOSIS — E063 Autoimmune thyroiditis: Secondary | ICD-10-CM

## 2024-03-03 DIAGNOSIS — E782 Mixed hyperlipidemia: Secondary | ICD-10-CM

## 2024-03-03 LAB — POCT GLYCOSYLATED HEMOGLOBIN (HGB A1C): Hemoglobin A1C: 6.2 % — AB (ref 4.0–5.6)

## 2024-03-03 MED ORDER — LEVOTHYROXINE SODIUM 88 MCG PO TABS: 88.0000 ug | ORAL_TABLET | Freq: Every day | ORAL | 3 refills | Status: AC

## 2024-03-03 MED ORDER — TIRZEPATIDE 5 MG/0.5ML ~~LOC~~ SOAJ: 5.0000 mg | SUBCUTANEOUS | 1 refills | Status: DC

## 2024-03-03 NOTE — Patient Instructions (Signed)

## 2024-03-03 NOTE — Progress Notes (Signed)
 Endocrinology Follow Up Note                                         03/03/2024, 3:21 PM  Subjective:   Subjective    Brandi [name] is a 78 y.o.-year-old female patient being seen in follow up after being seen in consultation for abnormal thyroid  function referred by Elliott, Dianne E.  She would also like to be seen here for her diabetes management as well.   Past Medical History:  Diagnosis Date   Cancer (HCC)    Diabetes mellitus (HCC)    Hyperlipidemia    Hypertension    Thyroid  disease     Past Surgical History:  Procedure Laterality Date   CYST EXCISION     1990    Social History   Socioeconomic History   Marital status: Married    Spouse name: Not on file   Number of children: Not on file   Years of education: Not on file   Highest education level: Not on file  Occupational History   Not on file  Tobacco Use   Smoking status: Never    Passive exposure: Past   Smokeless tobacco: Never  Vaping Use   Vaping status: Never Used  Substance and Sexual Activity   Alcohol use: Yes   Drug use: Never   Sexual activity: Not on file  Other Topics Concern   Not on file  Social History Narrative   Not on file   Social Drivers of Health   Financial Resource Strain: Not on file  Food Insecurity: No Food Insecurity (11/15/2021)   Received from [facility], [facility]   Hunger Vital Sign    Worried About Running Out of Food in the Last Year: Never true    Ran Out of Food in the Last Year: Never true  Transportation Needs: Not on file  Physical Activity: Not on file  Stress: Not on file  Social Connections: Unknown (03/16/2022)   Received from [facility], [facility]   Social Network    Social Network: Not on file    Family History  Problem Relation Age of Onset   Hypertension Mother    Diabetes Mother    Hyperlipidemia Mother    Heart attack Mother    Hypertension Father     Stroke Father     Outpatient Encounter Medications as of 03/03/2024  Medication Sig   acetaminophen (TYLENOL) 500 MG tablet Take by mouth.   amLODipine (NORVASC) 5 MG tablet Take 5 mg by mouth daily.   atorvastatin (LIPITOR) 20 MG tablet Take 20 mg by mouth daily.   Cholecalciferol (VITAMIN D ) 50 MCG (2000 UT) tablet Take 2,000 Units by mouth daily.   citalopram (CELEXA) 10 MG tablet Take 10 mg by mouth daily.   losartan  (COZAAR ) 25 MG tablet TAKE 1 TABLET (25 MG TOTAL) BY MOUTH DAILY.   ONETOUCH VERIO test strip USE TO TEST BLOOD GLUCOSE 3 TIMES DAILY   warfarin (COUMADIN) 5 MG tablet Take  by mouth. Takes 2 1/2 every day except on Wednesdays and Sundays only takes 5mg    [DISCONTINUED] levothyroxine  (SYNTHROID ) 88 MCG tablet Take 1 tablet (88 mcg total) by mouth daily before breakfast.   [DISCONTINUED] tirzepatide (MOUNJARO) 5 MG/0.5ML Pen Inject 5 mg into the skin once a week.   levothyroxine  (SYNTHROID ) 88 MCG tablet Take 1 tablet (88 mcg total) by mouth daily before breakfast.   tirzepatide (MOUNJARO) 5 MG/0.5ML Pen Inject 5 mg into the skin once a week.   No facility-administered encounter medications on file as of 03/03/2024.    ALLERGIES: Allergies  Allergen Reactions   Penicillins Swelling   Sufentanil Swelling   Sulfa Antibiotics Rash and Swelling   VACCINATION STATUS: Immunization History  Administered Date(s) Administered   Moderna Sars-Covid-2 Vaccination 01/13/2020, 02/10/2020, 10/10/2020     HPI  Thyroid  Problem Presents for follow-up visit. Symptoms include fatigue and weight gain. Patient reports no constipation, depressed mood, hair loss, leg swelling or palpitations (improved with Propanolol). The symptoms have been stable.  Diabetes She presents for her follow-up diabetic visit. She has type 2 diabetes mellitus. Onset time: diagnosed at approx age of 27. Her disease course has been improving. There are no hypoglycemic associated symptoms. Associated symptoms  include fatigue. There are no hypoglycemic complications. Diabetic complications include nephropathy. Risk factors for coronary artery disease include diabetes mellitus. Current diabetic treatments: Mounjaro. She is compliant with treatment most of the time. Her weight is increasing steadily. She is following a generally healthy diet. When asked about meal planning, she reported none. She has not had a previous visit with a dietitian. She rarely participates in exercise. Her home blood glucose trend is decreasing steadily. Her breakfast blood glucose range is generally 90-110 mg/dl. Her bedtime blood glucose range is generally 110-130 mg/dl. (She presents today with her logs showing at target glycemic profile overall.  Her POCT A1c today is 6.2%, improving from last visit of 7.6%.  She denies any hypoglycemia.  She has tolerated the Mounjaro well, had some nausea but it was related to the type of food she ate.) An ACE inhibitor/angiotensin II receptor blocker is being taken. She does not see a podiatrist.Eye exam is current.    Brandi [name]  is a patient with the above medical history. she was diagnosed with hypothyroidism at approximate age of 41 years, which required subsequent initiation of thyroid  hormone replacement. she was given various doses of Levothyroxine  over the years, currently on 25 (lowered between visits due to lab results) micrograms. She was taken off this medication last visit due to signs of over-replacement.  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  After long discussion over several different visits, she reluctantly moved forward with RAI ablation (was not a good candidate for Methimazole due to coumadin use for Factor V Leiden deficiency or surgery).  Her biggest concern was whether or not she would gain weight after treatment.  She had RAI on 12/10/22.  I reviewed patient's thyroid  tests:  Lab Results   Component Value Date   TSH 1.63 11/26/2023   TSH 0.61 05/20/2023   TSH 0.01 (A) 11/12/2022   TSH 0.01 (A) 09/04/2022   TSH 0.01 (A) 09/04/2022   TSH 0.01 (A) 07/17/2022   TSH 0.01 (A) 06/16/2022   TSH <0.005 (L) 04/17/2022   TSH <0.005 (L) 12/16/2021   TSH 0.01 (A) 09/11/2021   FREET4 1.32 11/26/2023   FREET4 3.22 (H) 04/17/2022  FREET4 2.91 (H) 12/16/2021    Pt denies feeling nodules in neck, hoarseness, dysphagia/odynophagia, SOB with lying down.  she does have family history of thyroid  disorders in her sister (hypothyroidism).  No family history of thyroid  cancer.   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   Review of systems  Constitutional: + decreasing body weight,  current Body mass index is 30.43 kg/m. , +  fatigue-improving, no subjective hyperthermia, no subjective hypothermia Eyes: no blurry vision, no xerophthalmia ENT: no sore throat, no nodules palpated in throat, no dysphagia/odynophagia, no hoarseness Cardiovascular: no chest pain, no shortness of breath, no palpitations, no leg swelling Respiratory: no cough, no shortness of breath Gastrointestinal: no nausea/vomiting/diarrhea Musculoskeletal: no muscle/joint aches Skin: no rashes, no hyperemia Neurological: no tremors, no numbness, no tingling, no dizziness Psychiatric: no depression, no anxiety   Objective:   Objective     BP 110/68 (BP Location: Right Arm, Patient Position: Sitting, Cuff Size: Large)   Pulse 88   Ht 5\' 3"  (1.6 m)   Wt 171 lb 12.8 oz (77.9 kg)   BMI 30.43 kg/m  Wt Readings from Last 3 Encounters:  03/03/24 171 lb 12.8 oz (77.9 kg)  12/03/23 179 lb 6.4 oz (81.4 kg)  08/31/23 174 lb 6.4 oz (79.1 kg)    BP Readings from Last 3 Encounters:  03/03/24 110/68  12/03/23 96/60  08/31/23 124/67      Physical Exam- Limited  Constitutional:  Body mass index is 30.43 kg/m. , not in acute distress, normal state of  mind Eyes:  EOMI, no exophthalmos Musculoskeletal: no gross deformities, strength intact in all four extremities, no gross restriction of joint movements Skin:  no rashes, no hyperemia Neurological: no tremor with outstretched hands    CMP ( most recent) CMP     Component Value Date/Time   NA 139 12/02/2021 0000   K 4.3 12/02/2021 0000   CL 106 12/02/2021 0000   CO2 25 (A) 12/02/2021 0000   BUN 20 05/20/2023 0000   CREATININE 1.2 (A) 05/20/2023 0000   CREATININE 0.89 02/16/2009 0905   CALCIUM 9.4 12/02/2021 0000   GFRNONAA >60 02/16/2009 0905   GFRAA  02/16/2009 0905    >60        The eGFR has been calculated using the MDRD equation. This calculation has not been validated in all clinical situations. eGFR's persistently <60 mL/min signify possible Chronic Kidney Disease.     Diabetic Labs (most recent): Lab Results  Component Value Date   HGBA1C 6.2 (A) 03/03/2024   HGBA1C 7.6% 10/22/2023   HGBA1C 7.2 (A) 08/31/2023   MICROALBUR 7.8 04/13/2023   MICROALBUR 80 mg/L 03/25/2023   MICROALBUR 30 01/21/2022     Lipid Panel ( most recent) Lipid Panel     Component Value Date/Time   CHOL 131 12/02/2021 0000   TRIG 156 04/13/2023 0000   HDL 37 12/02/2021 0000   LDLCALC 64 04/13/2023 0000       Lab Results  Component Value Date   TSH 1.63 11/26/2023   TSH 0.61 05/20/2023   TSH 0.01 (A) 11/12/2022   TSH 0.01 (A) 09/04/2022   TSH 0.01 (A) 09/04/2022   TSH 0.01 (A) 07/17/2022   TSH 0.01 (A) 06/16/2022   TSH <0.005 (L) 04/17/2022   TSH <0.005 (L) 12/16/2021   TSH 0.01 (A) 09/11/2021   FREET4 1.32 11/26/2023   FREET4 3.22 (H) 04/17/2022   FREET4 2.91 (  H) 12/16/2021     Uptake and Scan from 10/08/22 CLINICAL DATA:  Hyperthyroidism, anxiety, weight loss, tremors, palpitations, increased tiredness, brittle nails, suppressed TSH   EXAM: THYROID  SCAN AND UPTAKE - 4 AND 24 HOURS   TECHNIQUE: Following oral administration of I-123 capsule, anterior  planar imaging was acquired at 24 hours. Thyroid  uptake was calculated with a thyroid  probe at 4-6 hours and 24 hours.   RADIOPHARMACEUTICALS:  427 uCi I-123 sodium iodide p.o.   COMPARISON:  None Available.   FINDINGS: Homogeneous tracer distribution in both thyroid  lobes.   No focal areas of increased or decreased tracer localization.   4 hour I-123 uptake = 17.8% (normal 5-20%)   24 hour I-123 uptake = 40.4% (normal 10-30%)   IMPRESSION: Homogeneous tracer uptake within both thyroid  lobes with elevated 24 hour radio iodine uptake of 40.4%.   Findings consistent with Graves disease.     Electronically Signed   By: Wynelle Heather M.D.   On: 10/08/2022 13:36               Assessment & Plan:   ASSESSMENT / PLAN:  1) Hyperthyroidism-r/t Graves disease  She had RAI ablation on 12/10/22, immediately following the treatment, she did have significant worsening of symptoms and was treated with short course of oral steroids as well as Propanolol.   The symptoms she experienced were rapid weight loss, decreased appetite, debilitating fatigue, dry mouth, and trouble swallowing.  She notes she ended up quitting her job as the symptoms were so bad.  These have all significantly improved as time has gone on.   Her previsit thyroid  function tests are consistent with appropriate hormone replacement.  She is advised to continue her Levothyroxine  88 mcg po daily before breakfast.    - The correct intake of thyroid  hormone (Levothyroxine , Synthroid ), is on empty stomach first thing in the morning, with water, separated by at least 30 minutes from breakfast and other medications,  and separated by more than 4 hours from calcium, iron, multivitamins, acid reflux medications (PPIs).  - This medication is a life-long medication and will be needed to correct thyroid  hormone imbalances for the rest of your life.  The dose may change from time to time, based on thyroid  blood work.  - It is  extremely important to be consistent taking this medication, near the same time each morning.  -AVOID TAKING PRODUCTS CONTAINING BIOTIN (commonly found in Hair, Skin, Nails vitamins) AS IT INTERFERES WITH THE VALIDITY OF THYROID  FUNCTION BLOOD TESTS.  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  Her POCT A1c today is 6.2%, improving from last visit of 7.6%.  She denies any hypoglycemia.  She has tolerated the Mounjaro well, had some nausea but it was related to the type of food she ate.   - Nutritional counseling repeated at each appointment due to patients tendency to fall back in to old habits.  - The patient admits there is a room for improvement in their diet and drink choices. -  Suggestion is made for the patient to avoid simple carbohydrates from their diet including Cakes, Sweet Desserts / Pastries, Ice Cream, Soda (diet and regular), Sweet Tea, Candies, Chips, Cookies, Sweet Pastries, Store Bought Juices, Alcohol in Excess of 1-2 drinks a day, Artificial Sweeteners, Coffee Creamer, and "Sugar-free" Products. This will help patient to have stable blood glucose  profile and potentially avoid unintended weight gain.   - I encouraged the patient to switch to unprocessed or minimally processed complex starch and increased protein intake (animal or plant source), fruits, and vegetables.   - Patient is advised to stick to a routine mealtimes to eat 3 meals a day and avoid unnecessary snacks (to snack only to correct hypoglycemia).  -She is advised to continue Mounjaro 5 mg SQ weekly.  She does not need to monitor glucose routinely given safe regimen.      I spent  39  minutes in the care of the patient today including review of labs from CMP, Lipids, Thyroid  Function, Hematology (current and previous including abstractions from other facilities);  face-to-face time discussing  her blood glucose readings/logs, discussing hypoglycemia and hyperglycemia episodes and symptoms, medications doses, her options of short and long term treatment based on the latest standards of care / guidelines;  discussion about incorporating lifestyle medicine;  and documenting the encounter. Risk reduction counseling performed per USPSTF guidelines to reduce obesity and cardiovascular risk factors.     Please refer to Patient Instructions for Blood Glucose Monitoring and Insulin/Medications Dosing Guide"  in media tab for additional information. Please  also refer to " Patient Self Inventory" in the Media  tab for reviewed elements of pertinent patient history.  Brandi [name] participated in the discussions, expressed understanding, and voiced agreement with the above plans.  All questions were answered to her satisfaction. she is encouraged to contact clinic should she have any questions or concerns prior to her return visit.    FOLLOW UP PLAN:  Return in about 4 months (around 07/04/2024) for Diabetes F/U with A1c in office, Previsit labs.  Hulon Magic, [facility] [address] [place], [place] [address] Phone: [phone] Fax: [phone]  03/03/2024, 3:21 PM

## 2024-03-22 ENCOUNTER — Other Ambulatory Visit: Payer: Self-pay | Admitting: Nurse Practitioner

## 2024-06-30 LAB — LAB REPORT - SCANNED
EGFR: 55
Free T4: 1.73 ng/dL
TSH: 1.63

## 2024-07-05 ENCOUNTER — Encounter: Payer: Self-pay | Admitting: Nurse Practitioner

## 2024-07-05 ENCOUNTER — Ambulatory Visit: Admitting: Nurse Practitioner

## 2024-07-05 VITALS — BP 110/70 | HR 70 | Ht 60.0 in | Wt 169.6 lb

## 2024-07-05 DIAGNOSIS — Z7985 Long-term (current) use of injectable non-insulin antidiabetic drugs: Secondary | ICD-10-CM

## 2024-07-05 DIAGNOSIS — E0590 Thyrotoxicosis, unspecified without thyrotoxic crisis or storm: Secondary | ICD-10-CM | POA: Diagnosis not present

## 2024-07-05 DIAGNOSIS — E559 Vitamin D deficiency, unspecified: Secondary | ICD-10-CM | POA: Diagnosis not present

## 2024-07-05 DIAGNOSIS — E890 Postprocedural hypothyroidism: Secondary | ICD-10-CM

## 2024-07-05 DIAGNOSIS — E782 Mixed hyperlipidemia: Secondary | ICD-10-CM

## 2024-07-05 DIAGNOSIS — I10 Essential (primary) hypertension: Secondary | ICD-10-CM

## 2024-07-05 DIAGNOSIS — E1122 Type 2 diabetes mellitus with diabetic chronic kidney disease: Secondary | ICD-10-CM | POA: Diagnosis not present

## 2024-07-05 DIAGNOSIS — N1831 Chronic kidney disease, stage 3a: Secondary | ICD-10-CM | POA: Diagnosis not present

## 2024-07-05 LAB — POCT GLYCOSYLATED HEMOGLOBIN (HGB A1C): Hemoglobin A1C: 5.9 % — AB (ref 4.0–5.6)

## 2024-07-05 LAB — POCT UA - MICROALBUMIN

## 2024-07-05 MED ORDER — TIRZEPATIDE 7.5 MG/0.5ML ~~LOC~~ SOAJ: 7.5000 mg | SUBCUTANEOUS | 1 refills | Status: AC

## 2024-07-05 NOTE — Patient Instructions (Signed)

## 2024-07-05 NOTE — Progress Notes (Signed)
 Endocrinology Follow Up Note                                         07/05/2024, 3:08 PM  Subjective:   Subjective    Brandi [name] is a 78 y.o.-year-old female patient being seen in follow up after being seen in consultation for abnormal thyroid  function referred by Elliott, Dianne E.  She would also like to be seen here for her diabetes management as well.   Past Medical History:  Diagnosis Date   Cancer (HCC)    Diabetes mellitus (HCC)    Hyperlipidemia    Hypertension    Thyroid  disease     Past Surgical History:  Procedure Laterality Date   CYST EXCISION     1990    Social History   Socioeconomic History   Marital status: Married    Spouse name: Not on file   Number of children: Not on file   Years of education: Not on file   Highest education level: Not on file  Occupational History   Not on file  Tobacco Use   Smoking status: Never    Passive exposure: Past   Smokeless tobacco: Never  Vaping Use   Vaping status: Never Used  Substance and Sexual Activity   Alcohol use: Yes   Drug use: Never   Sexual activity: Not on file  Other Topics Concern   Not on file  Social History Narrative   Not on file   Social Drivers of Health   Financial Resource Strain: Not on file  Food Insecurity: No Food Insecurity (11/15/2021)   [facility]   Hunger Vital Sign    Within the past 12 months, you worried that your food would run out before you got the money to buy more.: Never true    Within the past 12 months, the food you bought just didn't last and you didn't have money to get more.: Never true  Transportation Needs: Not on file  Physical Activity: Not on file  Stress: Not on file  Social Connections: Unknown (03/16/2022)   Received from [facility]   Social Network    Social Network: Not on file    Family History  Problem Relation Age of Onset   Hypertension Mother     Diabetes Mother    Hyperlipidemia Mother    Heart attack Mother    Hypertension Father    Stroke Father     Outpatient Encounter Medications as of 07/05/2024  Medication Sig   acetaminophen (TYLENOL) 500 MG tablet Take by mouth.   amLODipine (NORVASC) 5 MG tablet Take 5 mg by mouth daily.   atorvastatin (LIPITOR) 20 MG tablet Take 20 mg by mouth daily.   Cholecalciferol (VITAMIN D ) 50 MCG (2000 UT) tablet Take 2,000 Units by mouth daily.   citalopram (CELEXA) 10 MG tablet Take 10 mg by mouth daily.   levothyroxine  (SYNTHROID ) 88 MCG tablet Take 1 tablet (88 mcg total) by mouth daily before breakfast.  losartan  (COZAAR ) 25 MG tablet TAKE 1 TABLET (25 MG TOTAL) BY MOUTH DAILY.   ONETOUCH VERIO test strip USE TO TEST BLOOD GLUCOSE 3 TIMES DAILY   tirzepatide  (MOUNJARO ) 5 MG/0.5ML Pen Inject 5 mg into the skin once a week.   warfarin (COUMADIN) 5 MG tablet Take by mouth. Takes 2 1/2 every day except on Wednesdays and Sundays only takes 5mg    No facility-administered encounter medications on file as of 07/05/2024.    ALLERGIES: Allergies  Allergen Reactions   Penicillins Swelling   Sufentanil Swelling   Sulfa Antibiotics Rash and Swelling   VACCINATION STATUS: Immunization History  Administered Date(s) Administered   Moderna Sars-Covid-2 Vaccination 01/13/2020, 02/10/2020, 10/10/2020     HPI  Thyroid  Problem Presents for follow-up visit. Patient reports no constipation, depressed mood, fatigue, hair loss, leg swelling, palpitations (improved with Propanolol) or weight gain. The symptoms have been stable.  Diabetes She presents for her follow-up diabetic visit. She has type 2 diabetes mellitus. Onset time: diagnosed at approx age of 50. Her disease course has been improving. There are no hypoglycemic associated symptoms. Pertinent negatives for diabetes include no fatigue. There are no hypoglycemic complications. Diabetic complications include nephropathy. Risk factors for coronary  artery disease include diabetes mellitus. Current diabetic treatments: Mounjaro . She is compliant with treatment most of the time. Her weight is decreasing steadily. She is following a generally healthy diet. When asked about meal planning, she reported none. She has not had a previous visit with a dietitian. She rarely participates in exercise. (She presents today with no meter or logs to review (was not asked to routinely monitor glucose due to safe regimen).  Her most recent A1c, checked by her PCP on 7/9 was 5.9%, improving from last visit of 6.2%.  She denies any hypoglycemia.  She has tolerated the Mounjaro  well, has noticed her food craving sneaking back in over the last several weeks.) An ACE inhibitor/angiotensin II receptor blocker is being taken. She does not see a podiatrist.Eye exam is current.    Brandi [name]  is a patient with the above medical history. she was diagnosed with hypothyroidism at approximate age of 68 years, which required subsequent initiation of thyroid  hormone replacement. she was given various doses of Levothyroxine  over the years, currently on 25 (lowered between visits due to lab results) micrograms. She was taken off this medication last visit due to signs of over-replacement.  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  After long discussion over several different visits, she reluctantly moved forward with RAI ablation (was not a good candidate for Methimazole due to coumadin use for Factor V Leiden deficiency or surgery).  Her biggest concern was whether or not she would gain weight after treatment.  She had RAI on 12/10/22.  I reviewed patient's thyroid  tests:  Lab Results  Component Value Date   TSH 1.63 06/30/2024   TSH 1.63 11/26/2023   TSH 0.61 05/20/2023   TSH 0.01 (A) 11/12/2022   TSH 0.01 (A) 09/04/2022   TSH 0.01 (A) 09/04/2022   TSH 0.01 (A) 07/17/2022   TSH 0.01 (A)  06/16/2022   TSH <0.005 (L) 04/17/2022   TSH <0.005 (L) 12/16/2021   FREET4 1.73 06/30/2024   FREET4 1.32 11/26/2023   FREET4 3.22 (H) 04/17/2022   FREET4 2.91 (H) 12/16/2021    Pt denies feeling nodules in neck, hoarseness, dysphagia/odynophagia, SOB with lying down.  she does have family history  of thyroid  disorders in her sister (hypothyroidism).  No family history of thyroid  cancer.   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   Review of systems  Constitutional: + decreasing body weight,  current Body mass index is 33.12 kg/m. , no fatigue, no subjective hyperthermia, no subjective hypothermia Eyes: no blurry vision, no xerophthalmia ENT: no sore throat, no nodules palpated in throat, no dysphagia/odynophagia, no hoarseness Cardiovascular: no chest pain, no shortness of breath, no palpitations, no leg swelling Respiratory: no cough, no shortness of breath Gastrointestinal: no nausea/vomiting/diarrhea Musculoskeletal: no muscle/joint aches Skin: no rashes, no hyperemia Neurological: no tremors, no numbness, no tingling, no dizziness Psychiatric: no depression, no anxiety   Objective:   Objective     BP 110/70 (BP Location: Left Arm, Patient Position: Sitting, Cuff Size: Large)   Pulse 70   Ht 5' (1.524 m)   Wt 169 lb 9.6 oz (76.9 kg)   BMI 33.12 kg/m  Wt Readings from Last 3 Encounters:  07/05/24 169 lb 9.6 oz (76.9 kg)  03/03/24 171 lb 12.8 oz (77.9 kg)  12/03/23 179 lb 6.4 oz (81.4 kg)    BP Readings from Last 3 Encounters:  07/05/24 110/70  03/03/24 110/68  12/03/23 96/60      Physical Exam- Limited  Constitutional:  Body mass index is 33.12 kg/m. , not in acute distress, normal state of mind Eyes:  EOMI, no exophthalmos Musculoskeletal: no gross deformities, strength intact in all four extremities, no gross restriction of joint movements Skin:  no rashes, no hyperemia Neurological: no  tremor with outstretched hands    CMP ( most recent) CMP     Component Value Date/Time   NA 139 12/02/2021 0000   K 4.3 12/02/2021 0000   CL 106 12/02/2021 0000   CO2 25 (A) 12/02/2021 0000   BUN 20 05/20/2023 0000   CREATININE 1.2 (A) 05/20/2023 0000   CREATININE 0.89 02/16/2009 0905   CALCIUM 9.4 12/02/2021 0000   GFRNONAA >60 02/16/2009 0905   GFRAA  02/16/2009 0905    >60        The eGFR has been calculated using the MDRD equation. This calculation has not been validated in all clinical situations. eGFR's persistently <60 mL/min signify possible Chronic Kidney Disease.     Diabetic Labs (most recent): Lab Results  Component Value Date   HGBA1C 6.2 (A) 03/03/2024   HGBA1C 7.6% 10/22/2023   HGBA1C 7.2 (A) 08/31/2023   MICROALBUR 7.8 04/13/2023   MICROALBUR 80 mg/L 03/25/2023   MICROALBUR 30 01/21/2022     Lipid Panel ( most recent) Lipid Panel     Component Value Date/Time   CHOL 131 12/02/2021 0000   TRIG 156 04/13/2023 0000   HDL 37 12/02/2021 0000   LDLCALC 64 04/13/2023 0000       Lab Results  Component Value Date   TSH 1.63 06/30/2024   TSH 1.63 11/26/2023   TSH 0.61 05/20/2023   TSH 0.01 (A) 11/12/2022   TSH 0.01 (A) 09/04/2022   TSH 0.01 (A) 09/04/2022   TSH 0.01 (A) 07/17/2022   TSH 0.01 (A) 06/16/2022   TSH <0.005 (L) 04/17/2022   TSH <0.005 (L) 12/16/2021   FREET4 1.73 06/30/2024   FREET4 1.32 11/26/2023   FREET4 3.22 (H) 04/17/2022   FREET4 2.91 (H) 12/16/2021     Uptake and Scan from 10/08/22 CLINICAL DATA:  Hyperthyroidism, anxiety, weight loss, tremors, palpitations, increased tiredness, brittle  nails, suppressed TSH   EXAM: THYROID  SCAN AND UPTAKE - 4 AND 24 HOURS   TECHNIQUE: Following oral administration of I-123 capsule, anterior planar imaging was acquired at 24 hours. Thyroid  uptake was calculated with a thyroid  probe at 4-6 hours and 24 hours.   RADIOPHARMACEUTICALS:  427 uCi I-123 sodium iodide p.o.    COMPARISON:  None Available.   FINDINGS: Homogeneous tracer distribution in both thyroid  lobes.   No focal areas of increased or decreased tracer localization.   4 hour I-123 uptake = 17.8% (normal 5-20%)   24 hour I-123 uptake = 40.4% (normal 10-30%)   IMPRESSION: Homogeneous tracer uptake within both thyroid  lobes with elevated 24 hour radio iodine uptake of 40.4%.   Findings consistent with Graves disease.     Electronically Signed   By: Oneil Kiss M.D.   On: 10/08/2022 13:36               Assessment & Plan:   ASSESSMENT / PLAN:  1) Hyperthyroidism-r/t Graves disease  She had RAI ablation on 12/10/22, immediately following the treatment, she did have significant worsening of symptoms and was treated with short course of oral steroids as well as Propanolol.   The symptoms she experienced were rapid weight loss, decreased appetite, debilitating fatigue, dry mouth, and trouble swallowing.  She notes she ended up quitting her job as the symptoms were so bad.  These have all significantly improved as time has gone on.  Her previsit thyroid  function tests are consistent with appropriate hormone replacement.  She is advised to continue her Levothyroxine  88 mcg po daily before breakfast.   She is aware that if she continues to lose weight, that her thyroid  dosage will likely need to be decreased in the future as well.   - The correct intake of thyroid  hormone (Levothyroxine , Synthroid ), is on empty stomach first thing in the morning, with water, separated by at least 30 minutes from breakfast and other medications,  and separated by more than 4 hours from calcium, iron, multivitamins, acid reflux medications (PPIs).  - This medication is a life-long medication and will be needed to correct thyroid  hormone imbalances for the rest of your life.  The dose may change from time to time, based on thyroid  blood work.  - It is extremely important to be consistent taking this  medication, near the same time each morning.  -AVOID TAKING PRODUCTS CONTAINING BIOTIN (commonly found in Hair, Skin, Nails vitamins) AS IT INTERFERES WITH THE VALIDITY OF THYROID  FUNCTION BLOOD TESTS.  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  Her most recent A1c, checked by her PCP on 7/9 was 5.9%, improving from last visit of 6.2%.  She denies any hypoglycemia.  She has tolerated the Mounjaro  well, has noticed her food craving sneaking back in over the last several weeks.  - Nutritional counseling repeated at each appointment due to patients tendency to fall back in to old habits.  - The patient admits there is a room for improvement in their diet and drink choices. -  Suggestion is made for the patient to avoid simple carbohydrates from their diet including Cakes, Sweet Desserts / Pastries, Ice Cream, Soda (diet and regular), Sweet Tea, Candies, Chips, Cookies, Sweet Pastries, Store Bought Juices, Alcohol in Excess of 1-2 drinks a day, Artificial  Sweeteners, Coffee Creamer, and Sugar-free Products. This will help patient to have stable blood glucose profile and potentially avoid unintended weight gain.   - I encouraged the patient to switch to unprocessed or minimally processed complex starch and increased protein intake (animal or plant source), fruits, and vegetables.   - Patient is advised to stick to a routine mealtimes to eat 3 meals a day and avoid unnecessary snacks (to snack only to correct hypoglycemia).  -Will increase her Mounjaro  to 7.5 mg SQ weekly.  I advised her to alternate the 7.5 mg and the 5 mg doses until she depletes her current supply.  She does not need to monitor glucose routinely given safe regimen.     I spent  40  minutes in the care of the patient today including review of labs  from CMP, Lipids, Thyroid  Function, Hematology (current and previous including abstractions from other facilities); face-to-face time discussing  her blood glucose readings/logs, discussing hypoglycemia and hyperglycemia episodes and symptoms, medications doses, her options of short and long term treatment based on the latest standards of care / guidelines;  discussion about incorporating lifestyle medicine;  and documenting the encounter. Risk reduction counseling performed per USPSTF guidelines to reduce obesity and cardiovascular risk factors.     Please refer to Patient Instructions for Blood Glucose Monitoring and Insulin/Medications Dosing Guide  in media tab for additional information. Please  also refer to  Patient Self Inventory in the Media  tab for reviewed elements of pertinent patient history.  Brandi [name] participated in the discussions, expressed understanding, and voiced agreement with the above plans.  All questions were answered to her satisfaction. she is encouraged to contact clinic should she have any questions or concerns prior to her return visit.    FOLLOW UP PLAN:  No follow-ups on file.  Benton Rio, [facility] [address] [place], [place] [address] Phone: [phone] Fax: [phone]  07/05/2024, 3:08 PM

## 2024-08-27 ENCOUNTER — Other Ambulatory Visit: Payer: Self-pay | Admitting: Nurse Practitioner

## 2024-11-07 ENCOUNTER — Ambulatory Visit: Admitting: Nurse Practitioner

## 2024-11-07 DIAGNOSIS — E559 Vitamin D deficiency, unspecified: Secondary | ICD-10-CM

## 2024-11-07 DIAGNOSIS — E890 Postprocedural hypothyroidism: Secondary | ICD-10-CM

## 2024-11-07 DIAGNOSIS — N1831 Chronic kidney disease, stage 3a: Secondary | ICD-10-CM

## 2024-11-07 DIAGNOSIS — I10 Essential (primary) hypertension: Secondary | ICD-10-CM

## 2024-11-07 DIAGNOSIS — E782 Mixed hyperlipidemia: Secondary | ICD-10-CM

## 2024-11-07 LAB — LAB REPORT - SCANNED
EGFR: 52
Free T4: 1.5 ng/dL
TSH: 5.17 (ref 0.41–5.90)

## 2024-11-08 ENCOUNTER — Ambulatory Visit: Admitting: Nurse Practitioner

## 2024-11-11 ENCOUNTER — Encounter: Payer: Self-pay | Admitting: Nurse Practitioner

## 2024-11-11 ENCOUNTER — Ambulatory Visit: Admitting: Nurse Practitioner

## 2024-11-11 VITALS — BP 118/72 | HR 78 | Ht 60.0 in | Wt 162.8 lb

## 2024-11-11 DIAGNOSIS — E782 Mixed hyperlipidemia: Secondary | ICD-10-CM | POA: Diagnosis not present

## 2024-11-11 DIAGNOSIS — N1831 Chronic kidney disease, stage 3a: Secondary | ICD-10-CM

## 2024-11-11 DIAGNOSIS — I10 Essential (primary) hypertension: Secondary | ICD-10-CM | POA: Diagnosis not present

## 2024-11-11 DIAGNOSIS — E559 Vitamin D deficiency, unspecified: Secondary | ICD-10-CM

## 2024-11-11 DIAGNOSIS — Z7985 Long-term (current) use of injectable non-insulin antidiabetic drugs: Secondary | ICD-10-CM | POA: Diagnosis not present

## 2024-11-11 DIAGNOSIS — E0590 Thyrotoxicosis, unspecified without thyrotoxic crisis or storm: Secondary | ICD-10-CM | POA: Diagnosis not present

## 2024-11-11 DIAGNOSIS — E890 Postprocedural hypothyroidism: Secondary | ICD-10-CM

## 2024-11-11 DIAGNOSIS — E1122 Type 2 diabetes mellitus with diabetic chronic kidney disease: Secondary | ICD-10-CM | POA: Diagnosis not present

## 2024-11-11 LAB — POCT GLYCOSYLATED HEMOGLOBIN (HGB A1C): Hemoglobin A1C: 6.1 % — AB (ref 4.0–5.6)

## 2024-11-11 NOTE — Patient Instructions (Signed)

## 2024-11-11 NOTE — Progress Notes (Signed)
 "                                                                    Endocrinology Follow Up Note                                         11/11/2024, 11:07 AM  Subjective:   Subjective    Brandi [name] is a 79 y.o.-year-old female patient being seen in follow up after being seen in consultation for abnormal thyroid  function referred by Elliott, Dianne E.  She would also like to be seen here for her diabetes management as well.   Past Medical History:  Diagnosis Date   Cancer (HCC)    Diabetes mellitus (HCC)    Hyperlipidemia    Hypertension    Thyroid  disease     Past Surgical History:  Procedure Laterality Date   CYST EXCISION     1990    Social History   Socioeconomic History   Marital status: Married    Spouse name: Not on file   Number of children: Not on file   Years of education: Not on file   Highest education level: Not on file  Occupational History   Not on file  Tobacco Use   Smoking status: Never    Passive exposure: Past   Smokeless tobacco: Never  Vaping Use   Vaping status: Never Used  Substance and Sexual Activity   Alcohol use: Yes   Drug use: Never   Sexual activity: Not on file  Other Topics Concern   Not on file  Social History Narrative   Not on file   Social Drivers of Health   Tobacco Use: Low Risk (11/11/2024)   Patient History    Smoking Tobacco Use: Never    Smokeless Tobacco Use: Never    Passive Exposure: Past  Financial Resource Strain: Not on file  Food Insecurity: No Food Insecurity (11/15/2021)   Received from [facility]   Epic    Within the past 12 months, you worried that your food would run out before you got the money to buy more.: Never true    Within the past 12 months, the food you bought just didn't last and you didn't have money to get more.: Never true  Transportation Needs: Not on file  Physical Activity: Not on file  Stress: Not on file  Social Connections: Not on file  Depression (EYV7-0): Not on file   Alcohol Screen: Not on file  Housing: Not on file  Utilities: Not on file  Health Literacy: Not on file    Family History  Problem Relation Age of Onset   Hypertension Mother    Diabetes Mother    Hyperlipidemia Mother    Heart attack Mother    Hypertension Father    Stroke Father     Outpatient Encounter Medications as of 11/11/2024  Medication Sig   acetaminophen (TYLENOL) 500 MG tablet Take by mouth.   amLODipine (NORVASC) 5 MG tablet Take 5 mg by mouth daily.   atorvastatin (LIPITOR) 20 MG tablet Take 20 mg by mouth daily.   Cholecalciferol (VITAMIN D ) 50 MCG (2000  UT) tablet Take 2,000 Units by mouth daily.   citalopram (CELEXA) 10 MG tablet Take 10 mg by mouth daily.   levothyroxine  (SYNTHROID ) 88 MCG tablet Take 1 tablet (88 mcg total) by mouth daily before breakfast.   losartan  (COZAAR ) 25 MG tablet TAKE 1 TABLET (25 MG TOTAL) BY MOUTH DAILY.   ONETOUCH VERIO test strip USE TO TEST BLOOD GLUCOSE 3 TIMES DAILY   tirzepatide  (MOUNJARO ) 7.5 MG/0.5ML Pen Inject 7.5 mg into the skin once a week.   warfarin (COUMADIN) 5 MG tablet Take by mouth. Takes 2 1/2 every day except on Wednesdays and Sundays only takes 5mg    No facility-administered encounter medications on file as of 11/11/2024.    ALLERGIES: Allergies  Allergen Reactions   Penicillins Swelling   Sufentanil Swelling   Sulfa Antibiotics Rash and Swelling   VACCINATION STATUS: Immunization History  Administered Date(s) Administered   Moderna Sars-Covid-2 Vaccination 01/13/2020, 02/10/2020, 10/10/2020     HPI  Thyroid  Problem Presents for follow-up visit. Symptoms include fatigue and weight loss. Patient reports no constipation, depressed mood, hair loss, leg swelling, palpitations (improved with Propanolol) or weight gain. The symptoms have been stable.  Diabetes She presents for her follow-up diabetic visit. She has type 2 diabetes mellitus. Onset time: diagnosed at approx age of 64. Her disease course has  been stable. There are no hypoglycemic associated symptoms. Associated symptoms include fatigue and weight loss. There are no hypoglycemic complications. Diabetic complications include nephropathy. Risk factors for coronary artery disease include diabetes mellitus. Current diabetic treatments: Mounjaro . She is compliant with treatment most of the time. Her weight is decreasing steadily. She is following a generally healthy diet. When asked about meal planning, she reported none. She has not had a previous visit with a dietitian. She rarely participates in exercise. (She presents today with her logs showing at goal glycemic profile.  Her POCT A1c today is 6.1%, increasing from last visit of 5.9% but still an acceptable target.  She has had cellulitis of left eye, was on antibiotics and steroids to treat this and may need surgery moving forward.) An ACE inhibitor/angiotensin II receptor blocker is being taken. She does not see a podiatrist.Eye exam is current.    Brandi [name]  is a patient with the above medical history. she was diagnosed with hypothyroidism at approximate age of 67 years, which required subsequent initiation of thyroid  hormone replacement. she was given various doses of Levothyroxine  over the years, currently on 25 (lowered between visits due to lab results) micrograms. She was taken off this medication last visit due to signs of over-replacement.  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  After long discussion over several different visits, she reluctantly moved forward with RAI ablation (was not a good candidate for Methimazole due to coumadin use for Factor V Leiden deficiency or surgery).  Her biggest concern was whether or not she would gain weight after treatment.  She had RAI on 12/10/22.  I reviewed patient's thyroid  tests:  Lab Results  Component Value Date   TSH 5.17 11/07/2024   TSH 1.63 06/30/2024    TSH 1.63 11/26/2023   TSH 0.61 05/20/2023   TSH 0.01 (A) 11/12/2022   TSH 0.01 (A) 09/04/2022   TSH 0.01 (A) 09/04/2022   TSH 0.01 (A) 07/17/2022   TSH 0.01 (A) 06/16/2022   TSH <0.005 (L) 04/17/2022   FREET4 1.50 11/07/2024   FREET4 1.73 06/30/2024  FREET4 1.32 11/26/2023   FREET4 3.22 (H) 04/17/2022   FREET4 2.91 (H) 12/16/2021    Pt denies feeling nodules in neck, hoarseness, dysphagia/odynophagia, SOB with lying down.  she does have family history of thyroid  disorders in her sister (hypothyroidism).  No family history of thyroid  cancer.   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   Review of systems  Constitutional: + decreasing body weight,  current Body mass index is 31.79 kg/m. , + fatigue, no subjective hyperthermia, no subjective hypothermia Eyes: + left eye issues from recent cellulitis- seeing surgeon ENT: no sore throat, no nodules palpated in throat, no dysphagia/odynophagia, no hoarseness Cardiovascular: no chest pain, no shortness of breath, no palpitations, no leg swelling Respiratory: no cough, no shortness of breath Gastrointestinal: no nausea/vomiting/diarrhea Musculoskeletal: no muscle/joint aches Skin: no rashes, no hyperemia Neurological: no tremors, no numbness, no tingling, no dizziness Psychiatric: no depression, no anxiety   Objective:   Objective     BP 118/72 (BP Location: Left Arm, Patient Position: Sitting, Cuff Size: Large)   Pulse 78   Ht 5' (1.524 m)   Wt 162 lb 12.8 oz (73.8 kg)   BMI 31.79 kg/m  Wt Readings from Last 3 Encounters:  11/11/24 162 lb 12.8 oz (73.8 kg)  07/05/24 169 lb 9.6 oz (76.9 kg)  03/03/24 171 lb 12.8 oz (77.9 kg)    BP Readings from Last 3 Encounters:  11/11/24 118/72  07/05/24 110/70  03/03/24 110/68      Physical Exam- Limited  Constitutional:  Body mass index is 31.79 kg/m. , not in acute distress, normal state of mind Eyes:   EOMI, no exophthalmos Musculoskeletal: no gross deformities, strength intact in all four extremities, no gross restriction of joint movements Skin:  no rashes, no hyperemia Neurological: no tremor with outstretched hands    CMP ( most recent) CMP     Component Value Date/Time   NA 139 12/02/2021 0000   K 4.3 12/02/2021 0000   CL 106 12/02/2021 0000   CO2 25 (A) 12/02/2021 0000   BUN 20 05/20/2023 0000   CREATININE 1.2 (A) 05/20/2023 0000   CREATININE 0.89 02/16/2009 0905   CALCIUM 9.4 12/02/2021 0000   GFRNONAA >60 02/16/2009 0905   GFRAA  02/16/2009 0905    >60        The eGFR has been calculated using the MDRD equation. This calculation has not been validated in all clinical situations. eGFR's persistently <60 mL/min signify possible Chronic Kidney Disease.     Diabetic Labs (most recent): Lab Results  Component Value Date   HGBA1C 6.1 (A) 11/11/2024   HGBA1C 5.9 (A) 07/05/2024   HGBA1C 6.2 (A) 03/03/2024   MICROALBUR 30mg /L 07/05/2024   MICROALBUR 7.8 04/13/2023   MICROALBUR 80 mg/L 03/25/2023     Lipid Panel ( most recent) Lipid Panel     Component Value Date/Time   CHOL 131 12/02/2021 0000   TRIG 156 04/13/2023 0000   HDL 37 12/02/2021 0000   LDLCALC 64 04/13/2023 0000       Lab Results  Component Value Date   TSH 5.17 11/07/2024   TSH 1.63 06/30/2024   TSH 1.63 11/26/2023   TSH 0.61 05/20/2023   TSH 0.01 (A) 11/12/2022   TSH 0.01 (A) 09/04/2022   TSH 0.01 (A) 09/04/2022   TSH 0.01 (A) 07/17/2022   TSH 0.01 (A) 06/16/2022   TSH <0.005 (L) 04/17/2022   FREET4  1.50 11/07/2024   FREET4 1.73 06/30/2024   FREET4 1.32 11/26/2023   FREET4 3.22 (H) 04/17/2022   FREET4 2.91 (H) 12/16/2021     Uptake and Scan from 10/08/22 CLINICAL DATA:  Hyperthyroidism, anxiety, weight loss, tremors, palpitations, increased tiredness, brittle nails, suppressed TSH   EXAM: THYROID  SCAN AND UPTAKE - 4 AND 24 HOURS   TECHNIQUE: Following oral administration  of I-123 capsule, anterior planar imaging was acquired at 24 hours. Thyroid  uptake was calculated with a thyroid  probe at 4-6 hours and 24 hours.   RADIOPHARMACEUTICALS:  427 uCi I-123 sodium iodide p.o.   COMPARISON:  None Available.   FINDINGS: Homogeneous tracer distribution in both thyroid  lobes.   No focal areas of increased or decreased tracer localization.   4 hour I-123 uptake = 17.8% (normal 5-20%)   24 hour I-123 uptake = 40.4% (normal 10-30%)   IMPRESSION: Homogeneous tracer uptake within both thyroid  lobes with elevated 24 hour radio iodine uptake of 40.4%.   Findings consistent with Graves disease.     Electronically Signed   By: Oneil Kiss M.D.   On: 10/08/2022 13:36               Assessment & Plan:   ASSESSMENT / PLAN:  1) Hyperthyroidism-r/t Graves disease  She had RAI ablation on 12/10/22, immediately following the treatment, she did have significant worsening of symptoms and was treated with short course of oral steroids as well as Propanolol.   The symptoms she experienced were rapid weight loss, decreased appetite, debilitating fatigue, dry mouth, and trouble swallowing.  She notes she ended up quitting her job as the symptoms were so bad.  These have all significantly improved as time has gone on.  Her previsit thyroid  function tests are a bit off today.  TSH is elevated (which can be as a result of recent steroid use) but Free T4 is also slightly elevated (possibly due to continued weight loss from Mounjaro ).  She denies any symptoms of over-replacement.    She is advised to continue her Levothyroxine  88 mcg po daily before breakfast for now.  Will repeat prior to next visit and adjust dose accordingly.   - The correct intake of thyroid  hormone (Levothyroxine , Synthroid ), is on empty stomach first thing in the morning, with water, separated by at least 30 minutes from breakfast and other medications,  and separated by more than 4 hours from  calcium, iron, multivitamins, acid reflux medications (PPIs).  - This medication is a life-long medication and will be needed to correct thyroid  hormone imbalances for the rest of your life.  The dose may change from time to time, based on thyroid  blood work.  - It is extremely important to be consistent taking this medication, near the same time each morning.  -AVOID TAKING PRODUCTS CONTAINING BIOTIN (commonly found in Hair, Skin, Nails vitamins) AS IT INTERFERES WITH THE VALIDITY OF THYROID  FUNCTION BLOOD TESTS.  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  Her POCT A1c today is 6.1%, increasing from last visit of 5.9% but still an acceptable target.  She has had cellulitis of left eye, was on antibiotics and steroids to treat this and may need surgery moving forward.  - Nutritional counseling repeated/built upon at each appointment.  - The patient admits there is a room for improvement in their diet and drink choices. -  Suggestion is made for the patient to avoid simple carbohydrates from their diet including Cakes, Sweet Desserts / Pastries, Ice Cream, Soda (diet and regular), Sweet Tea, Candies, Chips, Cookies, Sweet Pastries, Store Bought Juices, Alcohol in Excess of 1-2 drinks a day, Artificial Sweeteners, Coffee Creamer, and Sugar-free Products. This will help patient to have stable blood glucose profile and potentially avoid unintended weight gain.   - I encouraged the patient to switch to unprocessed or minimally processed complex starch and increased protein intake (animal or plant source), fruits, and vegetables.   - Patient is advised to stick to a routine mealtimes to eat 3 meals a day and avoid unnecessary snacks (to snack only to correct hypoglycemia).  -She is advised to continue Mounjaro  7.5 mg SQ weekly, she has really only been on this dose for  a little over 2 months as she was alternating between 5 mg to deplete her supply.  She does not need to monitor glucose routinely given safe regimen.     I spent  48  minutes in the care of the patient today including review of labs from CMP, Lipids, Thyroid  Function, Hematology (current and previous including abstractions from other facilities); face-to-face time discussing  her blood glucose readings/logs, discussing hypoglycemia and hyperglycemia episodes and symptoms, medications doses, her options of short and long term treatment based on the latest standards of care / guidelines;  discussion about incorporating lifestyle medicine;  and documenting the encounter. Risk reduction counseling performed per USPSTF guidelines to reduce obesity and cardiovascular risk factors.     Please refer to Patient Instructions for Blood Glucose Monitoring and Insulin/Medications Dosing Guide  in media tab for additional information. Please  also refer to  Patient Self Inventory in the Media  tab for reviewed elements of pertinent patient history.  Brandi [name] participated in the discussions, expressed understanding, and voiced agreement with the above plans.  All questions were answered to her satisfaction. she is encouraged to contact clinic should she have any questions or concerns prior to her return visit.    FOLLOW UP PLAN:  Return in about 4 months (around 03/11/2025) for Thyroid  follow up, Previsit labs, Diabetes F/U with A1c in office.  Benton Rio, [facility] [address] [place], [place] [address] Phone: [phone] Fax: [phone]  11/11/2024, 11:07 AM  "

## 2025-03-14 ENCOUNTER — Ambulatory Visit: Admitting: Nurse Practitioner
